# Patient Record
Sex: Male | Born: 1965 | Race: Black or African American | Hispanic: No | Marital: Married | Smoking: Former smoker
Health system: Southern US, Community
[De-identification: ages and names within clinical notes are randomized; demographics above are authoritative.]

## PROBLEM LIST (undated history)

## (undated) ENCOUNTER — Emergency Department (HOSPITAL_COMMUNITY): Admission: EM | Payer: Self-pay | Source: Home / Self Care

## (undated) DIAGNOSIS — I2510 Atherosclerotic heart disease of native coronary artery without angina pectoris: Secondary | ICD-10-CM

## (undated) DIAGNOSIS — M069 Rheumatoid arthritis, unspecified: Secondary | ICD-10-CM

## (undated) DIAGNOSIS — G4730 Sleep apnea, unspecified: Secondary | ICD-10-CM

## (undated) DIAGNOSIS — E785 Hyperlipidemia, unspecified: Secondary | ICD-10-CM

## (undated) DIAGNOSIS — R7303 Prediabetes: Secondary | ICD-10-CM

## (undated) DIAGNOSIS — F419 Anxiety disorder, unspecified: Secondary | ICD-10-CM

## (undated) DIAGNOSIS — I10 Essential (primary) hypertension: Secondary | ICD-10-CM

## (undated) HISTORY — PX: CORONARY ANGIOPLASTY WITH STENT PLACEMENT: SHX49

---

## 1998-10-27 ENCOUNTER — Emergency Department (HOSPITAL_COMMUNITY): Admission: EM | Admit: 1998-10-27 | Discharge: 1998-10-27 | Payer: Self-pay | Admitting: Emergency Medicine

## 1998-10-28 ENCOUNTER — Emergency Department (HOSPITAL_COMMUNITY): Admission: EM | Admit: 1998-10-28 | Discharge: 1998-10-28 | Payer: Self-pay | Admitting: Emergency Medicine

## 2001-01-11 ENCOUNTER — Encounter: Admission: RE | Admit: 2001-01-11 | Discharge: 2001-01-11 | Payer: Self-pay | Admitting: Occupational Medicine

## 2001-01-11 ENCOUNTER — Encounter: Payer: Self-pay | Admitting: Occupational Medicine

## 2003-01-20 ENCOUNTER — Emergency Department (HOSPITAL_COMMUNITY): Admission: EM | Admit: 2003-01-20 | Discharge: 2003-01-20 | Payer: Self-pay | Admitting: Emergency Medicine

## 2007-08-15 ENCOUNTER — Emergency Department (HOSPITAL_COMMUNITY): Admission: EM | Admit: 2007-08-15 | Discharge: 2007-08-15 | Payer: Self-pay | Admitting: Emergency Medicine

## 2008-07-30 ENCOUNTER — Inpatient Hospital Stay (HOSPITAL_COMMUNITY)
Admission: EM | Admit: 2008-07-30 | Discharge: 2008-08-02 | Payer: Self-pay | Source: Ambulatory Visit | Admitting: Emergency Medicine

## 2010-03-28 HISTORY — PX: CARDIAC CATHETERIZATION: SHX172

## 2010-06-28 ENCOUNTER — Inpatient Hospital Stay (HOSPITAL_COMMUNITY)
Admission: EM | Admit: 2010-06-28 | Discharge: 2010-07-02 | DRG: 550 | Disposition: A | Discharge status: Home / Self Care | Payer: BC Managed Care – PPO | Source: Ambulatory Visit | Attending: Interventional Cardiology | Admitting: Interventional Cardiology

## 2010-06-28 DIAGNOSIS — F17200 Nicotine dependence, unspecified, uncomplicated: Secondary | ICD-10-CM | POA: Diagnosis present

## 2010-06-28 DIAGNOSIS — I10 Essential (primary) hypertension: Secondary | ICD-10-CM | POA: Diagnosis present

## 2010-06-28 DIAGNOSIS — E119 Type 2 diabetes mellitus without complications: Secondary | ICD-10-CM | POA: Diagnosis present

## 2010-06-28 DIAGNOSIS — I4901 Ventricular fibrillation: Secondary | ICD-10-CM | POA: Diagnosis not present

## 2010-06-28 DIAGNOSIS — E785 Hyperlipidemia, unspecified: Secondary | ICD-10-CM | POA: Diagnosis present

## 2010-06-28 DIAGNOSIS — F411 Generalized anxiety disorder: Secondary | ICD-10-CM | POA: Diagnosis present

## 2010-06-28 DIAGNOSIS — IMO0002 Reserved for concepts with insufficient information to code with codable children: Secondary | ICD-10-CM | POA: Diagnosis present

## 2010-06-28 DIAGNOSIS — Z7982 Long term (current) use of aspirin: Secondary | ICD-10-CM

## 2010-06-28 DIAGNOSIS — X58XXXA Exposure to other specified factors, initial encounter: Secondary | ICD-10-CM | POA: Diagnosis present

## 2010-06-28 DIAGNOSIS — I2109 ST elevation (STEMI) myocardial infarction involving other coronary artery of anterior wall: Principal | ICD-10-CM | POA: Diagnosis present

## 2010-06-28 DIAGNOSIS — I2510 Atherosclerotic heart disease of native coronary artery without angina pectoris: Secondary | ICD-10-CM | POA: Diagnosis present

## 2010-06-28 LAB — COMPREHENSIVE METABOLIC PANEL
ALT: 25 U/L (ref 0–53)
AST: 31 U/L (ref 0–37)
Albumin: 3.1 g/dL — ABNORMAL LOW (ref 3.5–5.2)
Alkaline Phosphatase: 81 U/L (ref 39–117)
BUN: 6 mg/dL (ref 6–23)
CO2: 18 mEq/L — ABNORMAL LOW (ref 19–32)
Calcium: 7.8 mg/dL — ABNORMAL LOW (ref 8.4–10.5)
Chloride: 104 mEq/L (ref 96–112)
Creatinine, Ser: 1.01 mg/dL (ref 0.4–1.5)
GFR calc Af Amer: >60 mL/min (ref >60)
GFR calc non Af Amer: >60 mL/min (ref >60)
Glucose, Bld: 194 mg/dL — ABNORMAL HIGH (ref 70–99)
Potassium: 3 mEq/L — ABNORMAL LOW (ref 3.5–5.1)
Sodium: 134 mEq/L — ABNORMAL LOW (ref 135–145)
Total Bilirubin: 0.2 mg/dL — ABNORMAL LOW (ref 0.3–1.2)
Total Protein: 6.1 g/dL (ref 6.0–8.3)

## 2010-06-28 LAB — LIPID PANEL
Cholesterol: 186 mg/dL (ref 0–200)
HDL: 27 mg/dL — ABNORMAL LOW (ref >39)
LDL Cholesterol: 128 mg/dL — ABNORMAL HIGH (ref 0–99)
Total CHOL/HDL Ratio: 6.9 RATIO
VLDL: 31 mg/dL (ref 0–40)

## 2010-06-28 LAB — CARDIAC PANEL(CRET KIN+CKTOT+MB+TROPI)
CK, MB: 215.1 ng/mL (ref 0.3–4.0)
Relative Index: 10.7 — ABNORMAL HIGH (ref 0.0–2.5)
Total CK: 2004 U/L — ABNORMAL HIGH (ref 7–232)

## 2010-06-28 LAB — APTT: aPTT: >200 seconds (ref 24–37)

## 2010-06-28 LAB — RAPID URINE DRUG SCREEN, HOSP PERFORMED: Tetrahydrocannabinol: NOT DETECTED

## 2010-06-28 LAB — CBC
HCT: 39.9 % (ref 39.0–52.0)
Hemoglobin: 13.6 g/dL (ref 13.0–17.0)
MCH: 31.6 pg (ref 26.0–34.0)
MCHC: 34.1 g/dL (ref 30.0–36.0)
MCV: 92.6 fL (ref 78.0–100.0)
Platelets: 280 10*3/uL (ref 150–400)
RBC: 4.31 MIL/uL (ref 4.22–5.81)
RDW: 12.8 % (ref 11.5–15.5)
WBC: 9.6 10*3/uL (ref 4.0–10.5)

## 2010-06-28 LAB — PROTIME-INR
INR: 5.22 (ref 0.00–1.49)
Prothrombin Time: 47.8 seconds — ABNORMAL HIGH (ref 11.6–15.2)

## 2010-06-28 LAB — HEMOGLOBIN A1C
Hgb A1c MFr Bld: 5.8 % — ABNORMAL HIGH (ref <5.7)
Mean Plasma Glucose: 120 mg/dL — ABNORMAL HIGH (ref <117)

## 2010-06-29 LAB — CARDIAC PANEL(CRET KIN+CKTOT+MB+TROPI)
CK, MB: 98.9 ng/mL (ref 0.3–4.0)
Total CK: 1175 U/L — ABNORMAL HIGH (ref 7–232)
Troponin I: 20.77 ng/mL (ref 0.00–0.06)

## 2010-06-29 LAB — POCT I-STAT, CHEM 8
BUN: 4 mg/dL — ABNORMAL LOW (ref 6–23)
Calcium, Ion: 1.11 mmol/L — ABNORMAL LOW (ref 1.12–1.32)
TCO2: 17 mmol/L (ref 0–100)

## 2010-06-29 LAB — BASIC METABOLIC PANEL
Chloride: 100 mEq/L (ref 96–112)
Creatinine, Ser: 0.96 mg/dL (ref 0.4–1.5)
GFR calc Af Amer: >60 mL/min (ref >60)
Potassium: 4.1 mEq/L (ref 3.5–5.1)
Sodium: 132 mEq/L — ABNORMAL LOW (ref 135–145)

## 2010-06-29 LAB — CBC
HCT: 43.3 % (ref 39.0–52.0)
Hemoglobin: 15.1 g/dL (ref 13.0–17.0)
RBC: 4.74 MIL/uL (ref 4.22–5.81)
WBC: 14.1 10*3/uL — ABNORMAL HIGH (ref 4.0–10.5)

## 2010-06-29 LAB — POCT ACTIVATED CLOTTING TIME: Activated Clotting Time: 558 seconds

## 2010-06-29 LAB — PROTIME-INR: INR: 0.99 (ref 0.00–1.49)

## 2010-06-30 ENCOUNTER — Inpatient Hospital Stay (HOSPITAL_COMMUNITY): Payer: BC Managed Care – PPO

## 2010-06-30 LAB — BASIC METABOLIC PANEL
CO2: 25 mEq/L (ref 19–32)
Calcium: 8.6 mg/dL (ref 8.4–10.5)
Creatinine, Ser: 1.12 mg/dL (ref 0.4–1.5)
GFR calc Af Amer: >60 mL/min (ref >60)
Glucose, Bld: 107 mg/dL — ABNORMAL HIGH (ref 70–99)

## 2010-06-30 LAB — CARDIAC PANEL(CRET KIN+CKTOT+MB+TROPI): Total CK: 575 U/L — ABNORMAL HIGH (ref 7–232)

## 2010-07-01 LAB — BASIC METABOLIC PANEL
CO2: 23 mEq/L (ref 19–32)
Glucose, Bld: 133 mg/dL — ABNORMAL HIGH (ref 70–99)
Potassium: 3.9 mEq/L (ref 3.5–5.1)
Sodium: 133 mEq/L — ABNORMAL LOW (ref 135–145)

## 2010-07-06 LAB — POCT I-STAT, CHEM 8
BUN: 7 mg/dL (ref 6–23)
Calcium, Ion: 1.11 mmol/L — ABNORMAL LOW (ref 1.12–1.32)
Creatinine, Ser: 1.3 mg/dL (ref 0.4–1.5)
TCO2: 25 mmol/L (ref 0–100)

## 2010-07-06 LAB — CK TOTAL AND CKMB (NOT AT ARMC)
CK, MB: 106.9 ng/mL — ABNORMAL HIGH (ref 0.3–4.0)
Total CK: 831 U/L — ABNORMAL HIGH (ref 7–232)
Total CK: 977 U/L — ABNORMAL HIGH (ref 7–232)

## 2010-07-06 LAB — LIPID PANEL
HDL: 32 mg/dL — ABNORMAL LOW (ref >39)
Total CHOL/HDL Ratio: 7.9 RATIO
VLDL: 27 mg/dL (ref 0–40)

## 2010-07-06 LAB — CBC
HCT: 40.8 % (ref 39.0–52.0)
HCT: 44.9 % (ref 39.0–52.0)
Hemoglobin: 15.5 g/dL (ref 13.0–17.0)
MCHC: 34.4 g/dL (ref 30.0–36.0)
MCHC: 34.6 g/dL (ref 30.0–36.0)
MCV: 91.1 fL (ref 78.0–100.0)
Platelets: 202 10*3/uL (ref 150–400)
RDW: 14.5 % (ref 11.5–15.5)
RDW: 14.6 % (ref 11.5–15.5)

## 2010-07-06 LAB — CARDIAC PANEL(CRET KIN+CKTOT+MB+TROPI)
Total CK: 557 U/L — ABNORMAL HIGH (ref 7–232)
Troponin I: 9.45 ng/mL (ref 0.00–0.06)

## 2010-07-06 LAB — DIFFERENTIAL
Basophils Absolute: 0.2 10*3/uL — ABNORMAL HIGH (ref 0.0–0.1)
Basophils Relative: 2 % — ABNORMAL HIGH (ref 0–1)
Eosinophils Relative: 3 % (ref 0–5)
Monocytes Absolute: 1.1 10*3/uL — ABNORMAL HIGH (ref 0.1–1.0)

## 2010-07-06 LAB — BASIC METABOLIC PANEL
BUN: 5 mg/dL — ABNORMAL LOW (ref 6–23)
Chloride: 107 mEq/L (ref 96–112)
Creatinine, Ser: 0.94 mg/dL (ref 0.4–1.5)
Glucose, Bld: 99 mg/dL (ref 70–99)
Potassium: 3.8 mEq/L (ref 3.5–5.1)

## 2010-07-06 LAB — POCT CARDIAC MARKERS: Myoglobin, poc: 83.1 ng/mL (ref 12–200)

## 2010-07-06 LAB — BRAIN NATRIURETIC PEPTIDE: Pro B Natriuretic peptide (BNP): 110 pg/mL — ABNORMAL HIGH (ref 0.0–100.0)

## 2010-07-14 NOTE — Discharge Summary (Signed)
  Jacob [name], Jacob [name]                ACCOUNT NO.:  [id number]  MEDICAL RECORD NO.:  [id number]           PATIENT TYPE:  I  LOCATION:  2028                         FACILITY:  [facility]  PHYSICIAN:  Corky Crafts, MDDATE OF BIRTH:  16-Feb-1966  DATE OF ADMISSION:  06/28/2010 DATE OF DISCHARGE:  07/02/2010                              DISCHARGE SUMMARY   PRIMARY CARDIOLOGIST:  Lyn Records, MD  FINAL DIAGNOSES: 1. Acute anterior wall ST-elevation myocardial infarction. 2. Coronary artery disease. 3. Tobacco abuse. 4. Hyperlipidemia.  PROCEDURE PERFORMED:  Cardiac catheterization on June 28, 2010, with drug-eluting stent placement to the mid LAD.  Overall ejection fraction was 40%.  HOSPITAL COURSE:  The patient came in on June 28, 2010, and had anterior ST-elevation.  He was brought emergently for cardiac catheterization and prior to the cath had a VF arrest.  He was successfully shocked back into normal sinus rhythm.  He underwent catheterization and had a straight forward angioplasty to the LAD with drug-eluting stent.  He tolerated the procedure well.  He was monitored for several days.  He had some issues with low blood pressure.  He did have a smoking cessation consult.  His blood pressure medicines were titrated and eventually his blood pressure stabilized in the low 100 systolic range. He had no further chest pain.  He continued have some right knee swelling related to prior ACL tear and meniscus tear.  Peak CK was 2004 with a peak troponin of 29.2.  ACTIVITY:  Increase activity slowly.  No lifting more than 10 pounds for about a week.  DIET:  Low-sodium, heart-healthy diet.  He needs to stop smoking.  FOLLOWUP APPOINTMENT:  With Dr. Katrinka Blazing in 7-10 days.  He should call the office for an appointment.  DISCHARGE MEDICATIONS: 1. Aspirin 325 mg daily. 2. Lisinopril 5 mg daily. 3. Metoprolol XL 25 mg daily. 4. Sublingual nitroglycerin p.r.n. 5. Prasugrel 10 mg  daily. 6. Rosuvastatin 10 mg daily.  OTHER INSTRUCTIONS:  We will have him see Dr. Katrinka Blazing before he returns to work.  We will tentatively set July 19, 2010, as the possible day that he could return to work.     Corky Crafts, MD     JSV/MEDQ  D:  07/02/2010  T:  07/03/2010  Job:  [id number]  Electronically Signed by Lance Muss MD on 07/14/2010 02:27:40 PM

## 2010-07-14 NOTE — Cardiovascular Report (Signed)
[name], [name]                ACCOUNT NO.:  [id number]  MEDICAL RECORD NO.:  [id number]           PATIENT TYPE:  I  LOCATION:  2910                         FACILITY:  [facility]  PHYSICIAN:  Jacob [name], MDDATE OF BIRTH:  12-23-1965  DATE OF PROCEDURE: DATE OF DISCHARGE:                           CARDIAC CATHETERIZATION   PROCEDURE PERFORMED:  Left heart catheterization, left ventriculogram, coronary angiogram, PCI to LAD.  OPERATOR:  Jacob Crafts, MD  INDICATIONS:  Acute anterior ST elevation MI with defib arrest.  PROCEDURE NOTE:  The patient was brought to the cath lab.  He was prepped and draped in the usual sterile fashion, consent was implied. He had a brief VF arrest when he got on the table, this was resolved with successful April 02, 2012rillation.  His right groin was infiltrated with 1% lidocaine.  A 6-French sheath was placed into the right common femoral artery using modified Seldinger technique.  Right coronary artery angiography was performed using a JR-4 pigtail catheter. The catheter was advanced to the vessel ostium under fluoroscopic guidance.  Digital angiography was performed in multiple projections using hand injection of contrast.  Left coronary artery angiography was performed using a CLS 3.5 guiding catheter.  Catheter was advanced to the vessel ostium under fluoroscopic guidance.  Digital angiography was performed in multiple projections using hand injection of contrast.  The intervention was performed through the CLS guide, please see below for details.  A pigtail catheter was advanced to the ascending aorta and across the aortic valve under fluoroscopic guidance.  Power injection of contrast performed in RAO projection to image the left ventricle. Catheter was pulled back under continuous hemodynamic pressure monitoring.  The sheath was removed and Angio-Seal was deployed for hemostasis.  Angiomax was used.  Effient was also given to  the patient.  FINDINGS:  The right coronary artery is a large dominant vessel.  There is moderate diffuse atherosclerosis.  The stent in the distal RCA is patent. Left main has an ostial 20% plaque. Left circumflex is a large vessel.  There are mild irregularities.  The OM-1 and OM-2 are medium-sized and patent. Left anterior descending is occluded just after the first diagonal. After reperfusion it was noted that there was a long mid LAD lesion, first and second diagonals are medium-sized and widely patent.  There was a 40-50% distal LAD stenosis. Left ventriculogram showed mid to distal anterior apical hypokinesis, severe degree.  The estimated ejection fraction was 40%.  HEMODYNAMIC RESULTS:  Left ventricular pressure 85/17 with an LVEDP of 27 mmHg.  Aortic pressure 81/63 with a mean aortic pressure of 71.  PCI NARRATIVE:  A CLS 3.5 guiding catheter was used.  Prowater wire was used to cross the lesion in the LAD.  An Export catheter successfully retrieved significant thrombus, 2.5 x 15 Apex balloon was used to pre- dilate the area in the mid LAD.  Several doses of nitroglycerin were given intracoronary to treat distal vasospasm.  A 3.0 x 28 Promus stent was then deployed in the mid LAD at 16 atmospheres.  The stent was postdilated with a 3.5 x 20 Trent  Quantum balloon inflated to 16 atmospheres in the mid stent at 18 atmospheres in the proximal stent. There was no residual stenosis.  Lesion length was 24 mm.  TIMI 3 flow was restored after initial TIMI flow was 0.  IMPRESSION: 1. Acute anterior myocardial infarction secondary to occluded left     anterior descending coronary artery, ventricular fibrillation     arrest in the cath lab related to ischemia. 2. Decreased left ventricular function with moderately increased left     ventricular end diastolic pressure. 3. Patent right coronary artery stent. 4. No aortic valve gradient.  RECOMMENDATIONS:  Continue aspirin and Effient.   He will need smoking cessation, he will be watched in the CCU.     Jacob Crafts, MD     JSV/MEDQ  D:  06/28/2010  T:  06/29/2010  Job:  [id number]  cc:   Jacob [name], M.D.  Electronically Signed by Jacob Muss MD on 07/14/2010 02:27:54 PM

## 2010-07-14 NOTE — H&P (Signed)
  NAMEELI, Jacob                ACCOUNT NO.:  [id number]  MEDICAL RECORD NO.:  [id number]           PATIENT TYPE:  I  LOCATION:  2910                         FACILITY:  [facility]  PHYSICIAN:  Corky Crafts, MDDATE OF BIRTH:  1965-11-17  DATE OF ADMISSION:  06/28/2010 DATE OF DISCHARGE:                             HISTORY & PHYSICAL   REASON FOR ADMISSION:  Acute anterior MI.  HISTORY OF PRESENT ILLNESS:  The patient is a 45 year old male with a history of inferior ST-elevation MI and 2010.  At that time, he had a drug-eluting stent placed to the distal right coronary artery.  He had chest pain starting about 8:30 this morning.  He called EMS and had anterior ST elevation on his ECG.  A code STEMI was called.  Upon getting on the cath stable, he also had some VF, which was subsequently shocked and normal sinus rhythm was restored.  He felt dizziness with the arrhythmia.  Currently, his pain is about 6/10 and has been waxing and waning somewhat.  PAST MEDICAL HISTORY: 1. Coronary artery disease with prior MI. 2. Borderline diabetes. 3. History of polysubstance abuse. 4. Right knee problem.  PAST SURGICAL HISTORY:  Teeth removal and testicular torsion surgery.  ALLERGIES:  No known drug allergies.  MEDICATIONS AT HOME:  Aspirin 81 mg daily.  SOCIAL HISTORY:  He is a smoker.  He drinks beer daily.  He has a history of polysubstance abuse.  FAMILY HISTORY:  Significant coronary artery disease in his father.  REVIEW OF SYSTEMS:  Significant for the chest discomfort as noted above. No bleeding problems.  He has right knee pain.  No swelling.  No focal weakness.  All other systems are negative.  PHYSICAL EXAMINATION:  VITAL SIGNS:  Blood pressure 110/60 and heart rate of 18. GENERAL:  He is awake and alert, in no apparent stress. HEAD:  Normocephalic and atraumatic. EYES:  Extraocular movements are intact. NECK:  No JVD. CARDIOVASCULAR:  Regular rate and  rhythm. LUNGS:  No wheezing. ABDOMEN:  Soft and nontender. EXTREMITIES:  2+ right groin pulse. EXTREMITIES:  No edema. NEURO:  No focal deficits.  LABORATORY WORK:  Pending.  ECG shows normal sinus rhythm with anterior ST elevation.  ASSESSMENT/PLAN: 1. This is a 45 year old with acute anterior ST-elevation MI.  He will     be brought to the Cath Lab.  He will likely undergo intervention     and plans will be based on this. 2. He needs to stop smoking. 3. He needs to be on a statin as well.     Corky Crafts, MD     JSV/MEDQ  D:  06/28/2010  T:  06/29/2010  Job:  [id number]  Electronically Signed by Lance Muss MD on 07/14/2010 02:27:46 PM

## 2010-07-26 ENCOUNTER — Ambulatory Visit (HOSPITAL_COMMUNITY): Payer: BC Managed Care – PPO

## 2010-07-28 ENCOUNTER — Ambulatory Visit (HOSPITAL_COMMUNITY): Payer: BC Managed Care – PPO

## 2010-07-29 ENCOUNTER — Emergency Department (HOSPITAL_COMMUNITY)
Admission: EM | Admit: 2010-07-29 | Discharge: 2010-07-29 | Disposition: A | Discharge status: Home / Self Care | Payer: BC Managed Care – PPO | Attending: Emergency Medicine | Admitting: Emergency Medicine

## 2010-07-29 ENCOUNTER — Emergency Department (HOSPITAL_COMMUNITY): Payer: BC Managed Care – PPO

## 2010-07-29 DIAGNOSIS — R079 Chest pain, unspecified: Secondary | ICD-10-CM | POA: Insufficient documentation

## 2010-07-29 DIAGNOSIS — R05 Cough: Secondary | ICD-10-CM | POA: Insufficient documentation

## 2010-07-29 DIAGNOSIS — R059 Cough, unspecified: Secondary | ICD-10-CM | POA: Insufficient documentation

## 2010-07-29 DIAGNOSIS — Z7982 Long term (current) use of aspirin: Secondary | ICD-10-CM | POA: Insufficient documentation

## 2010-07-29 DIAGNOSIS — Z79899 Other long term (current) drug therapy: Secondary | ICD-10-CM | POA: Insufficient documentation

## 2010-07-29 DIAGNOSIS — I252 Old myocardial infarction: Secondary | ICD-10-CM | POA: Insufficient documentation

## 2010-07-29 LAB — CBC
MCH: 31.1 pg (ref 26.0–34.0)
MCHC: 34.3 g/dL (ref 30.0–36.0)
MCV: 90.7 fL (ref 78.0–100.0)
Platelets: 259 10*3/uL (ref 150–400)
RBC: 4.72 MIL/uL (ref 4.22–5.81)

## 2010-07-29 LAB — BASIC METABOLIC PANEL
BUN: 10 mg/dL (ref 6–23)
CO2: 27 mEq/L (ref 19–32)
Chloride: 104 mEq/L (ref 96–112)
Potassium: 3.8 mEq/L (ref 3.5–5.1)

## 2010-07-29 LAB — DIFFERENTIAL
Eosinophils Absolute: 0.2 10*3/uL (ref 0.0–0.7)
Eosinophils Relative: 3 % (ref 0–5)
Lymphs Abs: 2.3 10*3/uL (ref 0.7–4.0)
Monocytes Absolute: 0.7 10*3/uL (ref 0.1–1.0)
Monocytes Relative: 10 % (ref 3–12)
Neutrophils Relative %: 53 % (ref 43–77)

## 2010-07-29 LAB — POCT CARDIAC MARKERS
Myoglobin, poc: 86.3 ng/mL (ref 12–200)
Troponin i, poc: <0.05 ng/mL (ref 0.00–0.09)
Troponin i, poc: <0.05 ng/mL (ref 0.00–0.09)

## 2010-07-30 ENCOUNTER — Ambulatory Visit (HOSPITAL_COMMUNITY): Payer: BC Managed Care – PPO

## 2010-08-02 ENCOUNTER — Ambulatory Visit (HOSPITAL_COMMUNITY): Payer: BC Managed Care – PPO

## 2010-08-04 ENCOUNTER — Ambulatory Visit (HOSPITAL_COMMUNITY): Payer: BC Managed Care – PPO

## 2010-08-05 NOTE — Consult Note (Signed)
Jacob [name], Jacob [name]                ACCOUNT NO.:  [id number]  MEDICAL RECORD NO.:  [id number]           PATIENT TYPE:  E  LOCATION:  [facility]                         FACILITY:  [facility]  PHYSICIAN:  Lyn Records, M.D.   DATE OF BIRTH:  Jan 14, 1966  DATE OF CONSULTATION:  07/29/2010 DATE OF DISCHARGE:  07/29/2010                                CONSULTATION   REASON FOR EVALUATION:  Chest pain.  SUBJECTIVE:  The patient is 12 and has a prior history of coronary disease, having presented with an acute inferior infarction in 2010 at which time he received a drug-eluting stent in the distal RCA.  1 month ago he presented with LAD, total occlusion, and received a DES stent during ST elevation MI.  He has been asymptomatic since discharge from the hospital on July 01, 2010.  Last evening, he had coughing.  He awakened this morning, still with some coughing and had discomfort in the chest that he now characterizes as a soreness.  It was there if he would take deep breaths or due certain movements.  Coughing would cause discomfort to linger.  He has an anxiety disorder.  As the discomfort lingered during the day, he became uncertain of the source and came to the emergency room.  It does not feel like either episode of previous cardiac ischemia.  There is no exertional component.  He denies chills and fever.  There is no pleuritic component.  MEDICAL REGIMEN: 1. Effient 10 mg per day. 2. Crestor 10 mg per day. 3. Metoprolol succinate 25 mg daily. 4. Lisinopril 5 mg daily. 5. Aspirin 325 mg daily.  HABITS:  He has discontinued smoking.  SIGNIFICANT MEDICAL PROBLEMS: 1. Anxiety disorder. 2. Coronary atherosclerosis. 3. Hyperlipidemia.  PHYSICAL EXAMINATION:  GENERAL:  The patient is on the hospital gurney. He is in no distress. VITAL SIGNS:  Blood pressure is 110/70, heart rate 70. NECK:  The neck veins are not distended.  No bruits heard. LUNGS:  Clear. CARDIAC:  No S4 gallop.  No  murmur or rub is heard. ABDOMEN:  Soft. EXTREMITIES:  No edema.  Pulses 2+ and symmetric in the upper and lower extremities.  Point-of-care marker done approximately 4 hours after the patient's pain started is normal.  Chest x-ray is unremarkable.  Other laboratory data is unremarkable with hemoglobin of 14.7, potassium 3.8, creatinine 1.04. Chest x-ray is unremarkable.  EKG demonstrates poor R-wave progression with mid anterior lead T-wave inversion improved compared to the EKG tracing of June 29, 2010.  CONCLUSIONS: 1. Probable noncardiac chest pain. 2. Coronary atherosclerosis.     a.     Recent anteroseptal infarction with drug-eluting stent      implantation.     b.     Right coronary infarction treated with DES stent 2010. 3. Anxiety disorder. 4. Hyperlipidemia on therapy. 5. Continue cigarette smoking cessation.  PLAN:  The patient will be discharged from the emergency room.  CLINICAL OBSERVATION:  He is to return if the change in nature of the discomfort or familiar symptoms of ischemia occur.     Lyn Records, M.D.  HWS/MEDQ  D:  07/29/2010  T:  07/30/2010  Job:  [id number]  Electronically Signed by Verdis Prime M.D. on 08/05/2010 12:21:31 PM

## 2010-08-06 ENCOUNTER — Ambulatory Visit (HOSPITAL_COMMUNITY): Payer: BC Managed Care – PPO

## 2010-08-09 ENCOUNTER — Ambulatory Visit (HOSPITAL_COMMUNITY): Payer: BC Managed Care – PPO

## 2010-08-10 NOTE — Cardiovascular Report (Signed)
NAMEMITCHEL, DELDUCA                ACCOUNT NO.:  [id number]   MEDICAL RECORD NO.:  [id number]          PATIENT TYPE:  INP   LOCATION:  2906                         FACILITY:  [facility]   PHYSICIAN:  Lyn Records, M.D.   DATE OF BIRTH:  31-Mar-1965   DATE OF PROCEDURE:  07/30/2008  DATE OF DISCHARGE:                            CARDIAC CATHETERIZATION   INDICATION FOR THIS STUDY:  Acute inferior myocardial infarction.   PROCEDURE PERFORMED:  1. Left heart cath.  2. Selective coronary angio.  3. Left ventriculography.  4. Drug-eluting stent, distal right coronary artery.   DESCRIPTION:  The patient is a 45 year old African American gentleman  who has been having waxing and waning chest pain for the past 48 hours.  Starting at 2 p.m. this afternoon, a persisting substernal discomfort  developed.  He was brought immediately to the emergency room where he  was evaluated and found to have mild inferior ST elevation.  There was  some confusion about the STEMI call coverage.  Dr. Excell Seltzer was initially  paged, who was in another STEMI case.  Dr. Riley Kill was also tied up.  After reviewing this STEMI call schedule, however, it was determined  that Dr. Excell Seltzer was not the STEMI physician of that [facility], was on-call for the ER.  Because Dr. Anne Fu is on call this  evening, our arrangement is that we will cover the ER.  We therefore  brought the patient emergently to the cath lab where a 6-French sheath  was placed using the modified Seldinger technique.  After initiation of  the sheath, Angiomax was administered.  We then proceeded to perform  coronary angiography with a 6-French A2 multipurpose catheter.  We then  also used a 6-French JR-4 guide catheter to perform angioplasty on the  right coronary with distal stenting using a DES 23-mm long Xience V  stent.  We postdilated with a 3.25 x 20-mm long Voyager Bennett.  There was  some distal emboli that resolved with intracoronary  verapamil.  IV  dopamine and atropine were used to improve hemodynamics during the case.  He developed severe hypotension and bradycardia after initial flow was  reestablished.  We then continued IV fluids, atropine, and dopamine.   The patient's pain resolved completely.  ST-segment changes resolved  completely.  Angio-Seal was used to close the groin site.  No  complications occurred.   RESULTS:  1. Hemodynamic data:      a.     Aortic pressure 92/72.      b.     Left ventricular pressure 105/21.  2. Left ventriculography:  Normal LV size and function.  EF 60%.  3. Coronary angio.      a.     Left main coronary:  25% proximal.      b.     Left anterior descending coronary:  50% mid.      c.     Circumflex artery:  Luminal irregularities.      d.     Right coronary artery:  100% distally.  4. PCI of the right coronary distal:  100% to 0% after stenting with a      Xience V 3.0, postdilated to 3.25 mm with a Voyager  with TIMI      grade 3 flow.   CONCLUSION:  1. Successful RCA recanalization and stenting during RCA STEMI.  2. Moderate LAD disease.  3. Preserved LV function.   PLAN:  Angiomax and Integrilin.  Aspirin and Plavix for greater than 1  year.  Risk factor modification.  Further management pending clinical  profile.      Lyn Records, M.D.  Electronically Signed     HWS/MEDQ  D:  07/30/2008  T:  07/31/2008  Job:  [id number]

## 2010-08-10 NOTE — H&P (Signed)
Jacob [name], Jacob [name]                ACCOUNT NO.:  [id number]   MEDICAL RECORD NO.:  [id number]          PATIENT TYPE:  INP   LOCATION:  2906                         FACILITY:  [facility]   PHYSICIAN:  Lyn Records, M.D.   DATE OF BIRTH:  12/25/1965   DATE OF ADMISSION:  07/30/2008  DATE OF DISCHARGE:                              HISTORY & PHYSICAL   CHIEF COMPLAINT:  Chest pain.   HISTORY OF PRESENT ILLNESS:  Jacob [name] is a 45 year old male patient who  began having substernal chest pain at 2:00 p.m. today.  The pain was  unrelenting.  He presented to the emergency room where ST-segment  elevations were noted in the inferior lead.  He received IV  nitroglycerin, aspirin 300 mg, Plavix, and morphine, and he was brought  directly to the Cardiac Catheterization Lab.   FAMILY HISTORY:  He states that his father had heart disease.   ALLERGIES:  None.   MEDICATIONS:  None.   SOCIAL HISTORY:  He does smoke.  He does drink 40 ounces of beer a day.  Denies illicit drugs.   PAST MEDICAL HISTORY:  1. Anxiety.  2. Polysubstance abuse.   PHYSICAL EXAMINATION:  VITAL SIGNS:  Unremarkable.  Pulse 107, blood  pressure 167/108, respirations 20, and O2 saturation 100% on 2 L.  HEENT:  Grossly normal.  Sclerae clear.  Conjunctivae normal.  Nares  without drainage.  NECK:  No carotid upstrokes or carotid bruits.  No JVD or thyromegaly.  CHEST:  Clear to auscultation bilaterally.  No wheezes or rhonchi.  HEART:  Regular rate and rhythm.  No evidence of murmur, rub, or ectopy.  ABDOMEN:  Good bowel sounds.  Nontender, nondistended.  No masses.  No  bruits.  EXTREMITIES:  No peripheral edema.  SKIN:  Warm and dry.  NEUROLOGIC:  Cranial nerves II through XII grossly intact.   LABORATORY DATA:  Hemoglobin 16.7, hematocrit 49.  PT 13.1, INR 1.0.  Sodium 141, potassium 3.2, BUN 7, creatinine 1.3, and glucose 160.   ASSESSMENT AND PLAN:  1. Acute inferior myocardial infarction, to Cath Lab  emergently.  2. Hypokalemia, replete.  3. Polysubstance abuse, smoking cessation counseling.      Guy Franco, P.A.      Lyn Records, M.D.  Electronically Signed    LB/MEDQ  D:  07/30/2008  T:  07/31/2008  Job:  [id number]

## 2010-08-11 ENCOUNTER — Ambulatory Visit (HOSPITAL_COMMUNITY): Payer: BC Managed Care – PPO

## 2010-08-13 ENCOUNTER — Ambulatory Visit (HOSPITAL_COMMUNITY): Payer: BC Managed Care – PPO

## 2010-08-13 NOTE — Discharge Summary (Signed)
NAMEDAM, ASHRAF                ACCOUNT NO.:  [id number]   MEDICAL RECORD NO.:  [id number]           PATIENT TYPE:   LOCATION:                                 FACILITY:   PHYSICIAN:  Lyn Records, M.D.   DATE OF BIRTH:  04-19-1965   DATE OF ADMISSION:  DATE OF DISCHARGE:                               DISCHARGE SUMMARY   DISCHARGE DIAGNOSES:  1. Status post acute inferior myocardial infarction, ST-segment      elevation myocardial infarction.  2. Coronary artery disease.  3. Dyslipidemia.  4. Tobacco abuse, smoking cessation counseling.   Jacob [name] is a 45 year old male patient who began having chest pain  at 2:00 p.m. on the date of admission.  This was unrelenting, and he  called EMS.  ST segments on EKG were showing increased/elevation in the  inferior leads.  He received nitroglycerin, aspirin, 300 mg of Plavix,  and morphine.  He was brought directly to the cardiac catheterization  lab.  He was found to have a totally occluded distal right coronary  artery.  A XIENCE V stent was implanted with good results.   The patient was kept in the hospital over the next several days.  We  counseled the patient on smoking cessation.  We also had the cardiac  rehab see the patient.  Other lab work shows that his troponin was  elevated at 9.45.  Total cholesterol 254, triglycerides 135, HDL 32, LDL  195.  Sodium 139, potassium 3.8, BUN 5, creatinine 0.94, BNP 110.  Hemoglobin 15.5, hematocrit 44.9, white count 9.2, platelets 231.   The patient is being discharged to home in stable but improved  condition.  He is to follow up with Dr. Katrinka Blazing on Aug 07, 2008, at 2:40  p.m.   He is to go home on:  1. Plavix 75 mg a day for at least 1 year.  2. Enteric-coated aspirin 325 mg a day for at least 1 year.  3. Zocor 40 mg nightly.  4. Sublingual nitroglycerin p.r.n. chest pain.  5. NicoDerm patch 21 mg a day for 7 days, then 14 mg a day for 14      days.      Guy Franco,  P.A.      Lyn Records, M.D.  Electronically Signed    LB/MEDQ  D:  09/18/2008  T:  09/19/2008  Job:  [id number]

## 2010-08-16 ENCOUNTER — Ambulatory Visit (HOSPITAL_COMMUNITY): Payer: BC Managed Care – PPO

## 2010-08-18 ENCOUNTER — Ambulatory Visit (HOSPITAL_COMMUNITY): Payer: BC Managed Care – PPO

## 2010-08-20 ENCOUNTER — Ambulatory Visit (HOSPITAL_COMMUNITY): Payer: BC Managed Care – PPO

## 2010-08-23 ENCOUNTER — Ambulatory Visit (HOSPITAL_COMMUNITY): Payer: BC Managed Care – PPO

## 2010-08-25 ENCOUNTER — Ambulatory Visit (HOSPITAL_COMMUNITY): Payer: BC Managed Care – PPO

## 2010-08-27 ENCOUNTER — Ambulatory Visit (HOSPITAL_COMMUNITY): Payer: BC Managed Care – PPO

## 2010-08-30 ENCOUNTER — Ambulatory Visit (HOSPITAL_COMMUNITY): Payer: BC Managed Care – PPO

## 2010-09-01 ENCOUNTER — Ambulatory Visit (HOSPITAL_COMMUNITY): Payer: BC Managed Care – PPO

## 2010-09-03 ENCOUNTER — Ambulatory Visit (HOSPITAL_COMMUNITY): Payer: BC Managed Care – PPO

## 2010-09-06 ENCOUNTER — Ambulatory Visit (HOSPITAL_COMMUNITY): Payer: BC Managed Care – PPO

## 2010-09-08 ENCOUNTER — Ambulatory Visit (HOSPITAL_COMMUNITY): Payer: BC Managed Care – PPO

## 2010-09-10 ENCOUNTER — Ambulatory Visit (HOSPITAL_COMMUNITY): Payer: BC Managed Care – PPO

## 2010-09-13 ENCOUNTER — Ambulatory Visit (HOSPITAL_COMMUNITY): Payer: BC Managed Care – PPO

## 2010-09-15 ENCOUNTER — Ambulatory Visit (HOSPITAL_COMMUNITY): Payer: BC Managed Care – PPO

## 2010-09-17 ENCOUNTER — Ambulatory Visit (HOSPITAL_COMMUNITY): Payer: BC Managed Care – PPO

## 2010-09-20 ENCOUNTER — Ambulatory Visit (HOSPITAL_COMMUNITY): Payer: BC Managed Care – PPO

## 2010-09-22 ENCOUNTER — Ambulatory Visit (HOSPITAL_COMMUNITY): Payer: BC Managed Care – PPO

## 2010-09-24 ENCOUNTER — Ambulatory Visit (HOSPITAL_COMMUNITY): Payer: BC Managed Care – PPO

## 2010-09-27 ENCOUNTER — Ambulatory Visit (HOSPITAL_COMMUNITY): Payer: BC Managed Care – PPO

## 2010-09-29 ENCOUNTER — Ambulatory Visit (HOSPITAL_COMMUNITY): Payer: BC Managed Care – PPO

## 2010-10-01 ENCOUNTER — Ambulatory Visit (HOSPITAL_COMMUNITY): Payer: BC Managed Care – PPO

## 2010-10-04 ENCOUNTER — Ambulatory Visit (HOSPITAL_COMMUNITY): Payer: BC Managed Care – PPO

## 2010-10-06 ENCOUNTER — Ambulatory Visit (HOSPITAL_COMMUNITY): Payer: BC Managed Care – PPO

## 2010-10-08 ENCOUNTER — Ambulatory Visit (HOSPITAL_COMMUNITY): Payer: BC Managed Care – PPO

## 2010-10-11 ENCOUNTER — Ambulatory Visit (HOSPITAL_COMMUNITY): Payer: BC Managed Care – PPO

## 2010-10-13 ENCOUNTER — Ambulatory Visit (HOSPITAL_COMMUNITY): Payer: BC Managed Care – PPO

## 2010-10-15 ENCOUNTER — Ambulatory Visit (HOSPITAL_COMMUNITY): Payer: BC Managed Care – PPO

## 2010-10-18 ENCOUNTER — Ambulatory Visit (HOSPITAL_COMMUNITY): Payer: BC Managed Care – PPO

## 2010-10-20 ENCOUNTER — Ambulatory Visit (HOSPITAL_COMMUNITY): Payer: BC Managed Care – PPO

## 2010-10-22 ENCOUNTER — Ambulatory Visit (HOSPITAL_COMMUNITY): Payer: BC Managed Care – PPO

## 2010-10-25 ENCOUNTER — Ambulatory Visit (HOSPITAL_COMMUNITY): Payer: BC Managed Care – PPO

## 2010-10-27 ENCOUNTER — Ambulatory Visit (HOSPITAL_COMMUNITY): Payer: BC Managed Care – PPO

## 2010-10-29 ENCOUNTER — Ambulatory Visit (HOSPITAL_COMMUNITY): Payer: BC Managed Care – PPO

## 2010-11-01 ENCOUNTER — Ambulatory Visit (HOSPITAL_COMMUNITY): Payer: BC Managed Care – PPO

## 2010-11-03 ENCOUNTER — Ambulatory Visit (HOSPITAL_COMMUNITY): Payer: BC Managed Care – PPO

## 2010-11-05 ENCOUNTER — Ambulatory Visit (HOSPITAL_COMMUNITY): Payer: BC Managed Care – PPO

## 2010-11-08 ENCOUNTER — Ambulatory Visit (HOSPITAL_COMMUNITY): Payer: BC Managed Care – PPO

## 2010-11-10 ENCOUNTER — Ambulatory Visit (HOSPITAL_COMMUNITY): Payer: BC Managed Care – PPO

## 2010-11-12 ENCOUNTER — Ambulatory Visit (HOSPITAL_COMMUNITY): Payer: BC Managed Care – PPO

## 2010-11-15 ENCOUNTER — Ambulatory Visit (HOSPITAL_COMMUNITY): Payer: BC Managed Care – PPO

## 2010-11-17 ENCOUNTER — Ambulatory Visit (HOSPITAL_COMMUNITY): Payer: BC Managed Care – PPO

## 2010-11-19 ENCOUNTER — Ambulatory Visit (HOSPITAL_COMMUNITY): Payer: BC Managed Care – PPO

## 2010-11-22 ENCOUNTER — Ambulatory Visit (HOSPITAL_COMMUNITY): Payer: BC Managed Care – PPO

## 2010-11-24 ENCOUNTER — Ambulatory Visit (HOSPITAL_COMMUNITY): Payer: BC Managed Care – PPO

## 2010-11-26 ENCOUNTER — Ambulatory Visit (HOSPITAL_COMMUNITY): Payer: BC Managed Care – PPO

## 2010-11-29 ENCOUNTER — Ambulatory Visit (HOSPITAL_COMMUNITY): Payer: BC Managed Care – PPO

## 2010-12-01 ENCOUNTER — Ambulatory Visit (HOSPITAL_COMMUNITY): Payer: BC Managed Care – PPO

## 2010-12-03 ENCOUNTER — Ambulatory Visit (HOSPITAL_COMMUNITY): Payer: BC Managed Care – PPO

## 2010-12-06 ENCOUNTER — Ambulatory Visit (HOSPITAL_COMMUNITY): Payer: BC Managed Care – PPO

## 2010-12-08 ENCOUNTER — Ambulatory Visit (HOSPITAL_COMMUNITY): Payer: BC Managed Care – PPO

## 2010-12-10 ENCOUNTER — Ambulatory Visit (HOSPITAL_COMMUNITY): Payer: BC Managed Care – PPO

## 2011-04-20 ENCOUNTER — Emergency Department (HOSPITAL_COMMUNITY)
Admission: EM | Admit: 2011-04-20 | Discharge: 2011-04-20 | Disposition: A | Discharge status: Home / Self Care | Payer: BC Managed Care – PPO | Attending: Emergency Medicine | Admitting: Emergency Medicine

## 2011-04-20 ENCOUNTER — Encounter (HOSPITAL_COMMUNITY): Payer: Self-pay | Admitting: Emergency Medicine

## 2011-04-20 DIAGNOSIS — I10 Essential (primary) hypertension: Secondary | ICD-10-CM | POA: Insufficient documentation

## 2011-04-20 DIAGNOSIS — R040 Epistaxis: Secondary | ICD-10-CM | POA: Insufficient documentation

## 2011-04-20 DIAGNOSIS — I2510 Atherosclerotic heart disease of native coronary artery without angina pectoris: Secondary | ICD-10-CM | POA: Insufficient documentation

## 2011-04-20 HISTORY — DX: Atherosclerotic heart disease of native coronary artery without angina pectoris: I25.10

## 2011-04-20 HISTORY — DX: Essential (primary) hypertension: I10

## 2011-04-20 MED ORDER — SILVER NITRATE-POT NITRATE 75-25 % EX MISC
1.0000 | Freq: Once | CUTANEOUS | Status: AC
  Administered 2011-04-20: 1 via TOPICAL

## 2011-04-20 MED ORDER — METOPROLOL SUCCINATE ER 25 MG PO TB24
25.0000 mg | ORAL_TABLET | Freq: Every day | ORAL | Status: AC
  Administered 2011-04-20: 25 mg via ORAL
  Filled 2011-04-20: qty 1

## 2011-04-20 NOTE — ED Provider Notes (Signed)
History     CSN: [id number]  Arrival date & time 04/20/11  1430   First MD Initiated Contact with Patient 04/20/11 1541      Chief Complaint  Patient presents with  . Epistaxis    (Consider location/radiation/quality/duration/timing/severity/associated sxs/prior treatment) HPI Patient presents emergent complaints of a nosebleed that started this afternoon. Patient didn't put pressure on it but it would not stop initially so he came to the emergent. Patient did put some tissue in his right nares and that has subsequently stopped the bleeding. Patient denies any headache. He does have history of having nosebleeds as a child but hasn't had one in a while. Patient also does mention he hasn't taken his blood pressure medications for the last 3 days because he has not had the money to fill them. He does plan on getting them filled tomorrow. Patient denies any bleeding elsewhere. Past Medical History  Diagnosis Date  . Coronary artery disease   . Hypertension     Past Surgical History  Procedure Date  . Coronary stent placement     No family history on file.  History  Substance Use Topics  . Smoking status: Never Smoker   . Smokeless tobacco: Not on file  . Alcohol Use: Yes      Review of Systems  All other systems reviewed and are negative.    Allergies  Review of patient's allergies indicates no known allergies.  Home Medications   Current Outpatient Rx  Name Route Sig Dispense Refill  . ASPIRIN EC 325 MG PO TBEC Oral Take 325 mg by mouth daily.    Marland Kitchen METOPROLOL SUCCINATE ER 25 MG PO TB24 Oral Take 25 mg by mouth daily.    Marland Kitchen PRASUGREL HCL 10 MG PO TABS Oral Take 10 mg by mouth daily.      BP 149/103  Pulse 98  Temp 98 F (36.7 C)  SpO2 98%  Physical Exam  Nursing note and vitals reviewed. Constitutional: He appears well-developed and well-nourished. No distress.  HENT:  Head: Normocephalic and atraumatic.  Right Ear: External ear normal.  Left Ear:  External ear normal.  Nose: No sinus tenderness, nasal deformity, septal deviation or nasal septal hematoma.       Dried blood right nares, no active bleeding  Eyes: Conjunctivae are normal. Right eye exhibits no discharge. Left eye exhibits no discharge. No scleral icterus.  Neck: Neck supple. No tracheal deviation present.  Cardiovascular: Normal rate, regular rhythm and intact distal pulses.   Pulmonary/Chest: Effort normal and breath sounds normal. No stridor. No respiratory distress. He has no wheezes. He has no rales.  Abdominal: Soft. Bowel sounds are normal. He exhibits no distension. There is no tenderness. There is no rebound and no guarding.  Musculoskeletal: He exhibits no edema and no tenderness.  Neurological: He is alert. He has normal strength. No sensory deficit. Cranial nerve deficit:  no gross defecits noted. He exhibits normal muscle tone. He displays no seizure activity. Coordination normal.  Skin: Skin is warm and dry. No rash noted.  Psychiatric: He has a normal mood and affect.    ED Course  Procedures (including critical care time) Silver nitrate applicator was applied to the right anterior nares. Patient tolerated procedure well Labs Reviewed - No data to display No results found.   1. Epistaxis       MDM  Patient with recurrent epistaxis. No active bleeding at this time. I encouraged him to use saline spray into apply pressure if he  has any recurrent bleeding. Patient was given a dose of his blood pressure medications. He does plan on getting them filled tomorrow        Celene Kras, MD 04/20/11 [phone]

## 2011-04-20 NOTE — ED Notes (Signed)
Nose bleed started this afaternoon stops and starts

## 2012-01-21 IMAGING — CR DG CHEST 2V
2 series · 2 of 2 positions shown · non-contrast
Comparison: 06/30/2010

CLINICAL DATA: Mid chest pain with cough.  Ex-smoker with cessation
1 month prior.  History of MRI with cardiac stent

CHEST - 2 VIEW

[w chest pa]
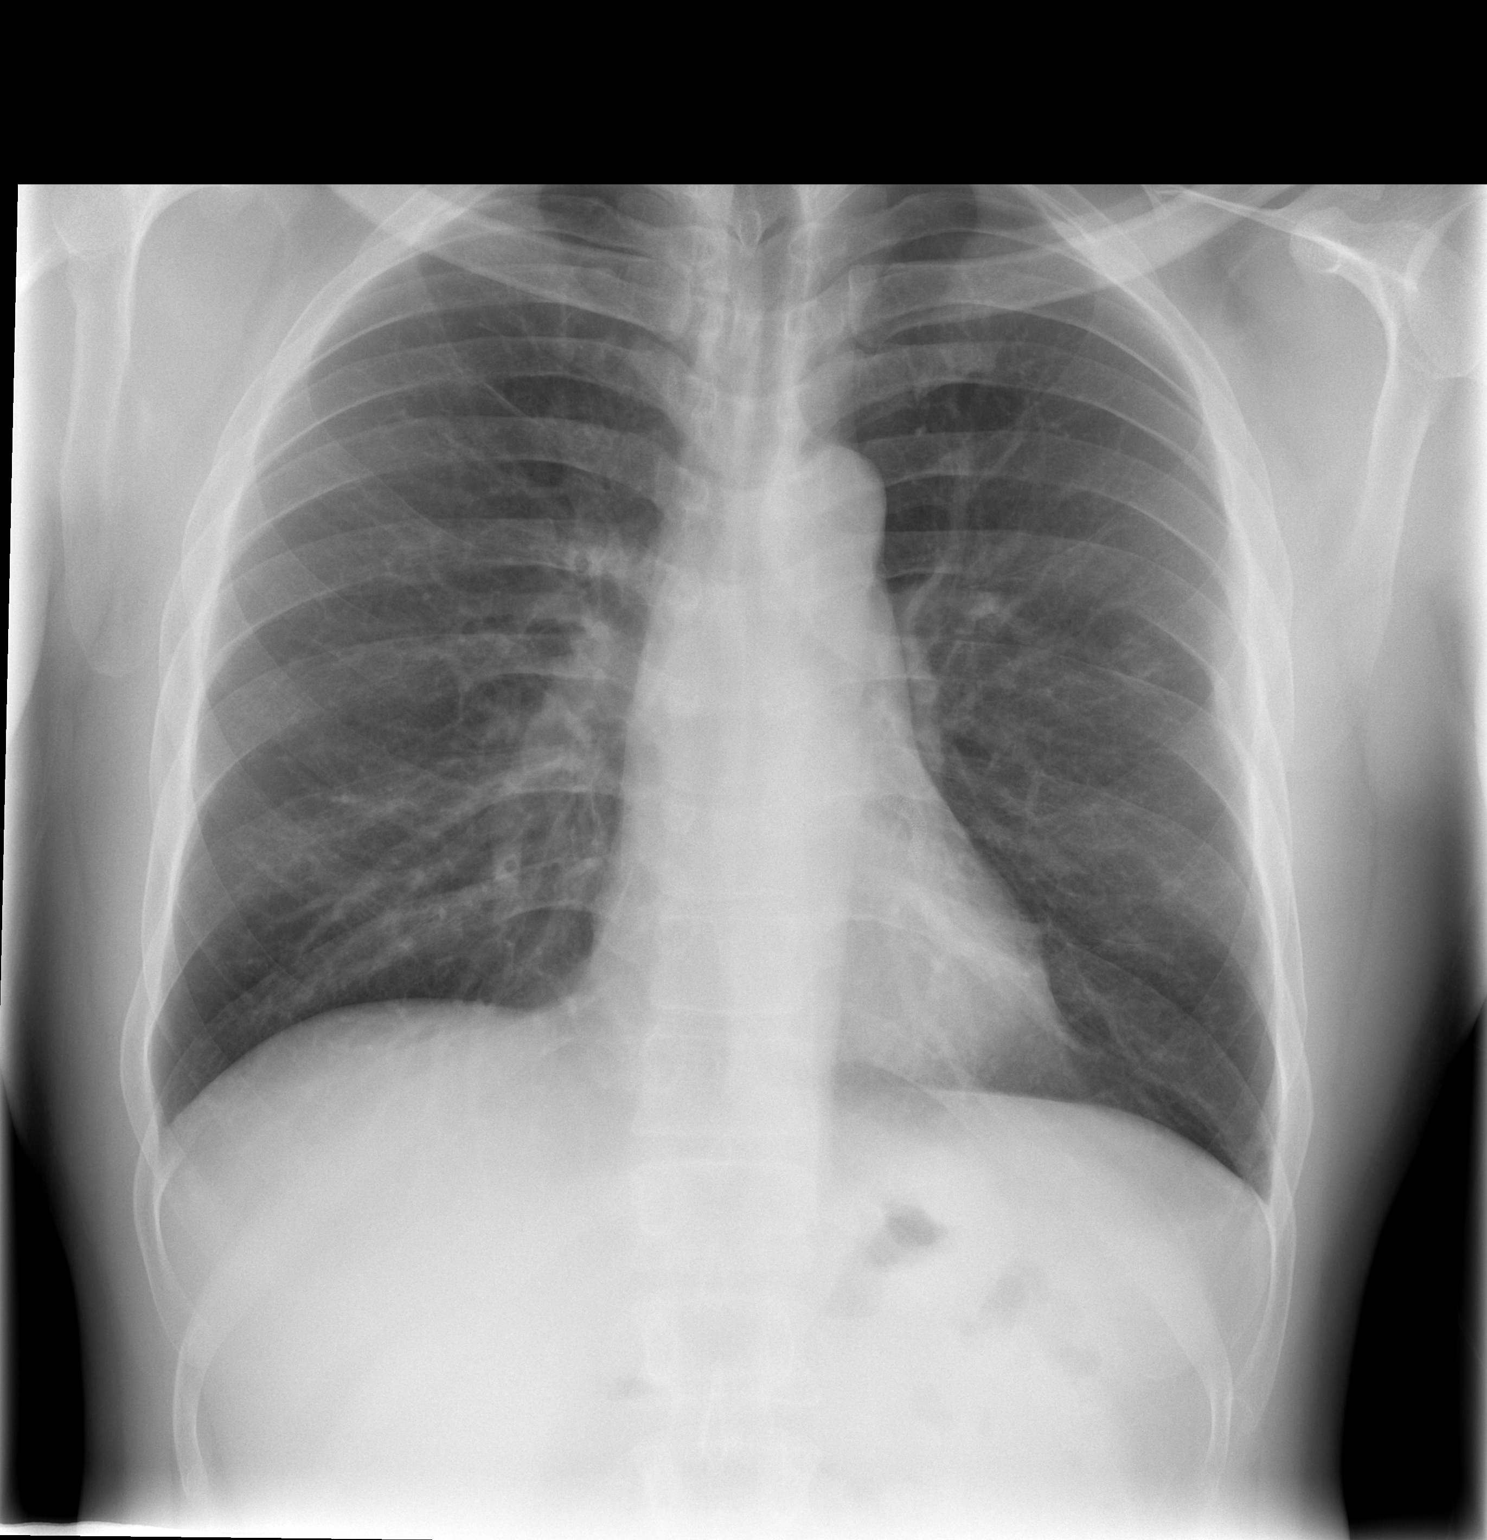

[w chest lat]
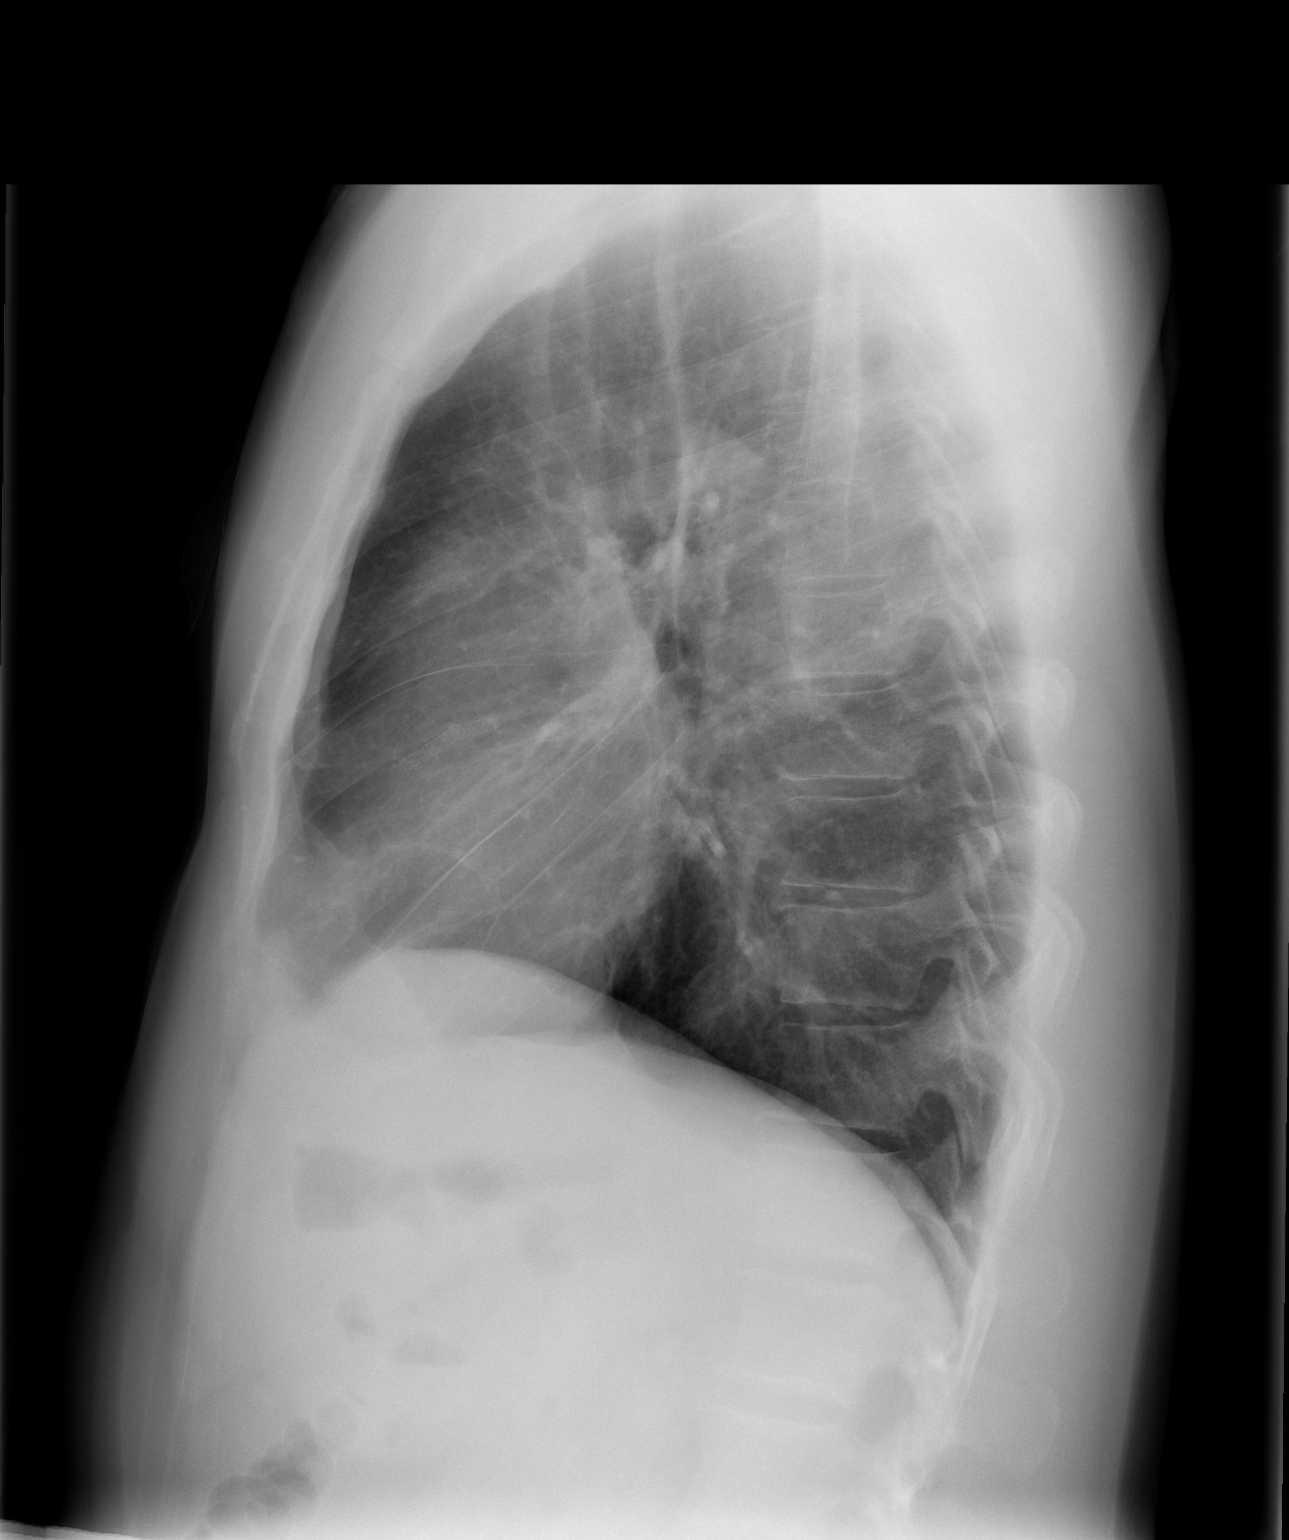

[2 of 2 positions shown; findings below may reference images not displayed]

FINDINGS: Heart and mediastinal contours are within normal limits.
The lung fields appear clear with no signs of focal infiltrate or
congestive failure.  No pleural fluid or peribronchial cuffing is
seen.  No signs of fissural prominence or interstitial septal lines
are seen to suggest early interstitial edema.

Bony structures are notable for a healed fracture of the lateral
aspect of the left seventh rib which is unchanged.
IMPRESSION: Stable cardiopulmonary appearance with no new focal or acute
abnormality identified.

## 2013-02-13 ENCOUNTER — Other Ambulatory Visit: Payer: Self-pay | Admitting: Interventional Cardiology

## 2013-02-14 NOTE — Telephone Encounter (Signed)
To [name]

## 2013-02-22 ENCOUNTER — Other Ambulatory Visit: Payer: Self-pay

## 2013-02-22 MED ORDER — NITROGLYCERIN 0.4 MG SL SUBL: 0.4000 mg | SUBLINGUAL_TABLET | SUBLINGUAL | Status: DC | PRN

## 2013-03-28 DIAGNOSIS — I219 Acute myocardial infarction, unspecified: Secondary | ICD-10-CM

## 2013-03-28 HISTORY — DX: Acute myocardial infarction, unspecified: I21.9

## 2013-06-20 ENCOUNTER — Encounter (HOSPITAL_COMMUNITY): Payer: Self-pay | Admitting: Emergency Medicine

## 2013-06-20 ENCOUNTER — Other Ambulatory Visit: Payer: Self-pay

## 2013-06-20 ENCOUNTER — Inpatient Hospital Stay (HOSPITAL_COMMUNITY)
Admission: EM | Admit: 2013-06-20 | Discharge: 2013-06-21 | DRG: 247 | Disposition: A | Discharge status: Home / Self Care | Payer: BC Managed Care – PPO | Attending: Interventional Cardiology | Admitting: Interventional Cardiology

## 2013-06-20 ENCOUNTER — Emergency Department (HOSPITAL_COMMUNITY): Payer: BC Managed Care – PPO

## 2013-06-20 ENCOUNTER — Encounter (HOSPITAL_COMMUNITY)
Admission: EM | Disposition: A | Discharge status: Home / Self Care | Payer: BC Managed Care – PPO | Source: Home / Self Care | Attending: Internal Medicine

## 2013-06-20 DIAGNOSIS — Z87891 Personal history of nicotine dependence: Secondary | ICD-10-CM

## 2013-06-20 DIAGNOSIS — I498 Other specified cardiac arrhythmias: Secondary | ICD-10-CM | POA: Diagnosis present

## 2013-06-20 DIAGNOSIS — I214 Non-ST elevation (NSTEMI) myocardial infarction: Principal | ICD-10-CM

## 2013-06-20 DIAGNOSIS — Z955 Presence of coronary angioplasty implant and graft: Secondary | ICD-10-CM

## 2013-06-20 DIAGNOSIS — Z9119 Patient's noncompliance with other medical treatment and regimen: Secondary | ICD-10-CM

## 2013-06-20 DIAGNOSIS — I209 Angina pectoris, unspecified: Secondary | ICD-10-CM

## 2013-06-20 DIAGNOSIS — Z9861 Coronary angioplasty status: Secondary | ICD-10-CM

## 2013-06-20 DIAGNOSIS — R7309 Other abnormal glucose: Secondary | ICD-10-CM | POA: Diagnosis present

## 2013-06-20 DIAGNOSIS — E7800 Pure hypercholesterolemia, unspecified: Secondary | ICD-10-CM | POA: Diagnosis present

## 2013-06-20 DIAGNOSIS — E785 Hyperlipidemia, unspecified: Secondary | ICD-10-CM | POA: Diagnosis present

## 2013-06-20 DIAGNOSIS — F411 Generalized anxiety disorder: Secondary | ICD-10-CM | POA: Diagnosis present

## 2013-06-20 DIAGNOSIS — R079 Chest pain, unspecified: Secondary | ICD-10-CM

## 2013-06-20 DIAGNOSIS — Z91199 Patient's noncompliance with other medical treatment and regimen due to unspecified reason: Secondary | ICD-10-CM

## 2013-06-20 DIAGNOSIS — Y831 Surgical operation with implant of artificial internal device as the cause of abnormal reaction of the patient, or of later complication, without mention of misadventure at the time of the procedure: Secondary | ICD-10-CM | POA: Diagnosis present

## 2013-06-20 DIAGNOSIS — I200 Unstable angina: Secondary | ICD-10-CM | POA: Insufficient documentation

## 2013-06-20 DIAGNOSIS — T82897A Other specified complication of cardiac prosthetic devices, implants and grafts, initial encounter: Secondary | ICD-10-CM | POA: Diagnosis present

## 2013-06-20 DIAGNOSIS — I2510 Atherosclerotic heart disease of native coronary artery without angina pectoris: Secondary | ICD-10-CM | POA: Diagnosis present

## 2013-06-20 DIAGNOSIS — R072 Precordial pain: Secondary | ICD-10-CM | POA: Diagnosis present

## 2013-06-20 DIAGNOSIS — F419 Anxiety disorder, unspecified: Secondary | ICD-10-CM | POA: Diagnosis present

## 2013-06-20 DIAGNOSIS — I10 Essential (primary) hypertension: Secondary | ICD-10-CM | POA: Diagnosis present

## 2013-06-20 HISTORY — DX: Anxiety disorder, unspecified: F41.9

## 2013-06-20 HISTORY — PX: LEFT HEART CATHETERIZATION WITH CORONARY ANGIOGRAM: SHX5451

## 2013-06-20 HISTORY — DX: Hyperlipidemia, unspecified: E78.5

## 2013-06-20 LAB — CREATININE, SERUM
Creatinine, Ser: 0.91 mg/dL (ref 0.50–1.35)
GFR calc Af Amer: >90 mL/min (ref >90)

## 2013-06-20 LAB — I-STAT TROPONIN, ED: Troponin i, poc: 0.03 ng/mL (ref 0.00–0.08)

## 2013-06-20 LAB — CBC
HCT: 46.2 % (ref 39.0–52.0)
HEMATOCRIT: 46.9 % (ref 39.0–52.0)
HEMOGLOBIN: 15.9 g/dL (ref 13.0–17.0)
HEMOGLOBIN: 15.7 g/dL (ref 13.0–17.0)
MCH: 30.6 pg (ref 26.0–34.0)
MCH: 30.5 pg (ref 26.0–34.0)
MCHC: 33.9 g/dL (ref 30.0–36.0)
MCHC: 34 g/dL (ref 30.0–36.0)
MCV: 90.2 fL (ref 78.0–100.0)
MCV: 89.7 fL (ref 78.0–100.0)
Platelets: 274 10*3/uL (ref 150–400)
Platelets: 240 10*3/uL (ref 150–400)
RBC: 5.2 MIL/uL (ref 4.22–5.81)
RBC: 5.15 MIL/uL (ref 4.22–5.81)
RDW: 14.2 % (ref 11.5–15.5)
RDW: 14.2 % (ref 11.5–15.5)
WBC: 7.8 10*3/uL (ref 4.0–10.5)
WBC: 6.1 10*3/uL (ref 4.0–10.5)

## 2013-06-20 LAB — APTT: aPTT: 31 seconds (ref 24–37)

## 2013-06-20 LAB — HEMOGLOBIN A1C
Hgb A1c MFr Bld: 6.4 % — ABNORMAL HIGH (ref <5.7)
MEAN PLASMA GLUCOSE: 137 mg/dL — AB (ref <117)

## 2013-06-20 LAB — POCT ACTIVATED CLOTTING TIME
ACTIVATED CLOTTING TIME: 387 s
Activated Clotting Time: 298 seconds

## 2013-06-20 LAB — BASIC METABOLIC PANEL
BUN: 10 mg/dL (ref 6–23)
CALCIUM: 9.3 mg/dL (ref 8.4–10.5)
CO2: 24 meq/L (ref 19–32)
CREATININE: 1 mg/dL (ref 0.50–1.35)
Chloride: 101 mEq/L (ref 96–112)
GFR calc Af Amer: >90 mL/min (ref >90)
GFR, EST NON AFRICAN AMERICAN: 88 mL/min — AB (ref >90)
GLUCOSE: 127 mg/dL — AB (ref 70–99)
Potassium: 3.6 mEq/L — ABNORMAL LOW (ref 3.7–5.3)
SODIUM: 138 meq/L (ref 137–147)

## 2013-06-20 LAB — TROPONIN I: TROPONIN I: 0.48 ng/mL — AB (ref <0.30)

## 2013-06-20 LAB — LIPID PANEL
CHOLESTEROL: 240 mg/dL — AB (ref 0–200)
HDL: 30 mg/dL — ABNORMAL LOW (ref >39)
LDL Cholesterol: 168 mg/dL — ABNORMAL HIGH (ref 0–99)
Total CHOL/HDL Ratio: 8 RATIO
Triglycerides: 210 mg/dL — ABNORMAL HIGH (ref <150)
VLDL: 42 mg/dL — AB (ref 0–40)

## 2013-06-20 LAB — PROTIME-INR
INR: 1.02 (ref 0.00–1.49)
Prothrombin Time: 13.2 seconds (ref 11.6–15.2)

## 2013-06-20 SURGERY — LEFT HEART CATHETERIZATION WITH CORONARY ANGIOGRAM
Anesthesia: LOCAL

## 2013-06-20 MED ORDER — TICAGRELOR 90 MG PO TABS
90.0000 mg | ORAL_TABLET | Freq: Two times a day (BID) | ORAL | Status: DC
  Administered 2013-06-20 – 2013-06-21 (×2): 90 mg via ORAL
  Filled 2013-06-20 (×3): qty 1

## 2013-06-20 MED ORDER — HEPARIN SODIUM (PORCINE) 1000 UNIT/ML IJ SOLN
INTRAMUSCULAR | Status: AC
  Filled 2013-06-20: qty 1

## 2013-06-20 MED ORDER — OXYCODONE-ACETAMINOPHEN 5-325 MG PO TABS: 1.0000 | ORAL_TABLET | ORAL | Status: DC | PRN

## 2013-06-20 MED ORDER — LIDOCAINE HCL (PF) 1 % IJ SOLN
INTRAMUSCULAR | Status: AC
  Filled 2013-06-20: qty 30

## 2013-06-20 MED ORDER — ATORVASTATIN CALCIUM 80 MG PO TABS
80.0000 mg | ORAL_TABLET | Freq: Every day | ORAL | Status: DC
Start: 2013-06-20 — End: 2013-06-21
  Administered 2013-06-20: 80 mg via ORAL
  Filled 2013-06-20 (×2): qty 1

## 2013-06-20 MED ORDER — ENOXAPARIN SODIUM 40 MG/0.4ML ~~LOC~~ SOLN
40.0000 mg | SUBCUTANEOUS | Status: DC
  Filled 2013-06-20 (×2): qty 0.4

## 2013-06-20 MED ORDER — SODIUM CHLORIDE 0.9 % IV SOLN
1.0000 mL/kg/h | INTRAVENOUS | Status: DC
  Administered 2013-06-20: 1 mL/kg/h via INTRAVENOUS

## 2013-06-20 MED ORDER — ACTIVE PARTNERSHIP FOR HEALTH OF YOUR HEART BOOK
Freq: Once | Status: AC
  Administered 2013-06-20: 20:00:00
  Filled 2013-06-20: qty 1

## 2013-06-20 MED ORDER — NITROGLYCERIN 0.2 MG/ML ON CALL CATH LAB
INTRAVENOUS | Status: AC
  Filled 2013-06-20: qty 1

## 2013-06-20 MED ORDER — SODIUM CHLORIDE 0.9 % IV SOLN: 250.0000 mL | INTRAVENOUS | Status: DC | PRN

## 2013-06-20 MED ORDER — PANTOPRAZOLE SODIUM 40 MG PO TBEC
40.0000 mg | DELAYED_RELEASE_TABLET | Freq: Every day | ORAL | Status: DC
  Administered 2013-06-20 – 2013-06-21 (×2): 40 mg via ORAL
  Filled 2013-06-20 (×2): qty 1

## 2013-06-20 MED ORDER — ACETAMINOPHEN 325 MG PO TABS
650.0000 mg | ORAL_TABLET | Freq: Four times a day (QID) | ORAL | Status: DC | PRN
Start: 2013-06-20 — End: 2013-06-20

## 2013-06-20 MED ORDER — DIAZEPAM 5 MG PO TABS
5.0000 mg | ORAL_TABLET | ORAL | Status: AC
Start: 2013-06-20 — End: 2013-06-20
  Administered 2013-06-20: 5 mg via ORAL
  Filled 2013-06-20: qty 1

## 2013-06-20 MED ORDER — TICAGRELOR 90 MG PO TABS
ORAL_TABLET | ORAL | Status: AC
  Administered 2013-06-20: 21:00:00 90 mg via ORAL
  Filled 2013-06-20: qty 1

## 2013-06-20 MED ORDER — HEPARIN (PORCINE) IN NACL 2-0.9 UNIT/ML-% IJ SOLN
INTRAMUSCULAR | Status: AC
  Filled 2013-06-20: qty 1500

## 2013-06-20 MED ORDER — TICAGRELOR 90 MG PO TABS
ORAL_TABLET | ORAL | Status: AC
  Filled 2013-06-20: qty 1

## 2013-06-20 MED ORDER — MIDAZOLAM HCL 2 MG/2ML IJ SOLN
INTRAMUSCULAR | Status: AC
  Filled 2013-06-20: qty 2

## 2013-06-20 MED ORDER — ASPIRIN 325 MG PO TABS
325.0000 mg | ORAL_TABLET | Freq: Every day | ORAL | Status: DC
  Filled 2013-06-20: qty 1

## 2013-06-20 MED ORDER — ASPIRIN 81 MG PO CHEW
81.0000 mg | CHEWABLE_TABLET | ORAL | Status: DC
  Filled 2013-06-20: qty 1

## 2013-06-20 MED ORDER — SODIUM CHLORIDE 0.9 % IV SOLN
INTRAVENOUS | Status: DC
  Administered 2013-06-20: 08:00:00 via INTRAVENOUS

## 2013-06-20 MED ORDER — METOPROLOL TARTRATE 12.5 MG HALF TABLET
12.5000 mg | ORAL_TABLET | Freq: Two times a day (BID) | ORAL | Status: DC
  Administered 2013-06-20 – 2013-06-21 (×3): 12.5 mg via ORAL
  Filled 2013-06-20 (×5): qty 1

## 2013-06-20 MED ORDER — ONDANSETRON HCL 4 MG PO TABS: 4.0000 mg | ORAL_TABLET | Freq: Four times a day (QID) | ORAL | Status: DC | PRN

## 2013-06-20 MED ORDER — ASPIRIN 81 MG PO CHEW
81.0000 mg | CHEWABLE_TABLET | Freq: Every day | ORAL | Status: DC
  Administered 2013-06-21: 11:00:00 81 mg via ORAL
  Filled 2013-06-20: qty 1

## 2013-06-20 MED ORDER — ACETAMINOPHEN 650 MG RE SUPP: 650.0000 mg | Freq: Four times a day (QID) | RECTAL | Status: DC | PRN

## 2013-06-20 MED ORDER — HEPARIN (PORCINE) IN NACL 100-0.45 UNIT/ML-% IJ SOLN
1200.0000 [IU]/h | INTRAMUSCULAR | Status: DC
  Administered 2013-06-20: 1200 [IU]/h via INTRAVENOUS
  Filled 2013-06-20: qty 250

## 2013-06-20 MED ORDER — SODIUM CHLORIDE 0.9 % IJ SOLN
3.0000 mL | Freq: Two times a day (BID) | INTRAMUSCULAR | Status: DC
  Administered 2013-06-20: 3 mL via INTRAVENOUS

## 2013-06-20 MED ORDER — FENTANYL CITRATE 0.05 MG/ML IJ SOLN
INTRAMUSCULAR | Status: AC
  Filled 2013-06-20: qty 2

## 2013-06-20 MED ORDER — HEPARIN BOLUS VIA INFUSION
4000.0000 [IU] | Freq: Once | INTRAVENOUS | Status: AC
  Administered 2013-06-20: 4000 [IU] via INTRAVENOUS
  Filled 2013-06-20: qty 4000

## 2013-06-20 MED ORDER — NITROGLYCERIN 0.4 MG SL SUBL: 0.4000 mg | SUBLINGUAL_TABLET | SUBLINGUAL | Status: DC | PRN

## 2013-06-20 MED ORDER — SODIUM CHLORIDE 0.9 % IV SOLN
INTRAVENOUS | Status: AC
  Administered 2013-06-20: 150 mL/h via INTRAVENOUS

## 2013-06-20 MED ORDER — SODIUM CHLORIDE 0.9 % IJ SOLN: 3.0000 mL | INTRAMUSCULAR | Status: DC | PRN

## 2013-06-20 MED ORDER — VERAPAMIL HCL 2.5 MG/ML IV SOLN
INTRAVENOUS | Status: AC
  Filled 2013-06-20: qty 2

## 2013-06-20 MED ORDER — ONDANSETRON HCL 4 MG/2ML IJ SOLN: 4.0000 mg | Freq: Four times a day (QID) | INTRAMUSCULAR | Status: DC | PRN

## 2013-06-20 NOTE — ED Notes (Signed)
Pt up to and back from b/r, steady gait, (denies: sob, nausea, dizziness, sob or other sx). Family arrives to [facility]. Pending lab results and pCXR.

## 2013-06-20 NOTE — Consult Note (Signed)
CARDIOLOGY CONSULT NOTE   Patient ID: Jacob [name] MRN: [id number], DOB/AGE: 47-Nov-1967   Admit date: 06/20/2013 Date of Consult: 06/20/2013   Primary Physician: Lesleigh Noe, MD Primary Cardiologist: Dr. Verdis Prime  Pt. Profile  48 year old gentleman admitted with left-sided chest discomfort.  Patient has a history of known ischemic heart disease.  Problem List  Past Medical History  Diagnosis Date  . Coronary artery disease   . Hypertension   . Anxiety disorder   . Hyperlipidemia     Past Surgical History  Procedure Laterality Date  . Coronary stent placement       Allergies  No Known Allergies  HPI    this 48 year old gentleman has a history of previous ischemic heart disease.  He had a history of an acute inferior infarction in 2010 and in 2012 with stent placement.  He has stopped taking all of his cardiac medications.  He has not been taking any daily aspirin.  He has been in his usual state of health until yesterday when at work he developed left arm discomfort and left chest discomfort.  There was no diaphoresis or significant dyspnea.  He thought that it might be indigestion from eating bad food.  He works second shift.  After he got off work he was still experiencing the discomfort and drank a beer, thinking that if he could that the pain would resolve but it did not.  He went home and lie down on the couch.  The pain persisted.  He then went in and told his wife and they called 911 he came to the emergency room.  This morning the patient is pain-free.  Inpatient Medications  . aspirin  325 mg Oral Daily  . enoxaparin (LOVENOX) injection  40 mg Subcutaneous Q24H  . pantoprazole  40 mg Oral Daily    Family History No family history on file.   Social History History   Social History  . Marital Status: Married    Spouse Name: N/A    Number of Children: N/A  . Years of Education: N/A   Occupational History  . Not on file.   Social History Main  Topics  . Smoking status: Former Smoker    Quit date: 09/24/2010  . Smokeless tobacco: Not on file  . Alcohol Use: Yes  . Drug Use: No  . Sexual Activity: Not on file   Other Topics Concern  . Not on file   Social History Narrative  . No narrative on file     Review of Systems  General:  No chills, fever, night sweats or weight changes.  Cardiovascular:  No chest pain, dyspnea on exertion, edema, orthopnea, palpitations, paroxysmal nocturnal dyspnea. Dermatological: No rash, lesions/masses Respiratory: No cough, dyspnea Urologic: No hematuria, dysuria Abdominal:   No nausea, vomiting, diarrhea, bright red blood per rectum, melena, or hematemesis Neurologic:  No visual changes, wkns, changes in mental status. All other systems reviewed and are otherwise negative except as noted above.  Physical Exam  Blood pressure 129/80, pulse 95, temperature 98.7 F (37.1 C), temperature source Oral, resp. rate 13, height 5\' 8"  (1.727 m), weight 210 lb (95.255 kg), SpO2 99.00%.  General: Pleasant, NAD Psych: Normal affect. Neuro: Alert and oriented X 3. Moves all extremities spontaneously. HEENT: Normal  Neck: Supple without bruits or JVD. Lungs:  Resp regular and unlabored, CTA. Heart: RRR no s3, s4, or murmurs. Abdomen: Soft, non-tender, non-distended, BS + x 4.  Extremities: No clubbing, cyanosis or edema. DP/PT/Radials  2+ and equal bilaterally.  Labs  No results found for this basename: CKTOTAL, CKMB, TROPONINI,  in the last 72 hours Lab Results  Component Value Date   WBC 7.8 06/20/2013   HGB 15.9 06/20/2013   HCT 46.9 06/20/2013   MCV 90.2 06/20/2013   PLT 274 06/20/2013     Recent Labs Lab 06/20/13 0325  NA 138  K 3.6*  CL 101  CO2 24  BUN 10  CREATININE 1.00  CALCIUM 9.3  GLUCOSE 127*   Lab Results  Component Value Date   CHOL  Value: 186        ATP III CLASSIFICATION:  <200     mg/dL   Desirable  025-852  mg/dL   Borderline High  >=778    mg/dL   High         05/02/2351   HDL 27* 06/28/2010   LDLCALC  Value: 128        Total Cholesterol/HDL:CHD Risk Coronary Heart Disease Risk Table                     Men   Women  1/2 Average Risk   3.4   3.3  Average Risk       5.0   4.4  2 X Average Risk   9.6   7.1  3 X Average Risk  23.4   11.0        Use the calculated Patient Ratio above and the CHD Risk Table to determine the patient's CHD Risk.        ATP III CLASSIFICATION (LDL):  <100     mg/dL   Optimal  614-431  mg/dL   Near or Above                    Optimal  130-159  mg/dL   Borderline  540-086  mg/dL   High  >761     mg/dL   Very High* 12/01/930   TRIG 156* 06/28/2010   No results found for this basename: DDIMER    Radiology/Studies  Dg Chest Port 1 View  06/20/2013   CLINICAL DATA:  Left-sided chest pain.  EXAM: PORTABLE CHEST - 1 VIEW  COMPARISON:  Chest x-ray 07/29/2010.  FINDINGS: Lung volumes are normal. No consolidative airspace disease. No pleural effusions. No pneumothorax. No pulmonary nodule or mass noted. Pulmonary vasculature and the cardiomediastinal silhouette are within normal limits.  IMPRESSION: 1.  No radiographic evidence of acute cardiopulmonary disease.   Electronically Signed   By: Trudie Reed M.D.   On: 06/20/2013 03:46    ECG  EKG shows normal sinus rhythm and nonspecific T-wave flattening  ASSESSMENT AND PLAN  1.  Chest pain, possible acute coronary syndrome.  Initial point-of-care troponins negative at 0.03.  EKG shows no acute changes but the chest discomfort is similar although less intense than his previous episodes in 2010 and 2012. 2. Hypercholesterolemia 3. medical noncompliance 4. essential hypertension 5. hyperglycemia rule out diabetes.  Check A1c  Recommendation: Will proceed with left heart cardiac catheterization today.  Discussed with patient who agrees.  He will need extensive education regarding risk factor modification and the importance of remaining on medications long-term  Signed, Cassell Clement,  MD  06/20/2013, 7:51 AM

## 2013-06-20 NOTE — ED Provider Notes (Signed)
CSN: [id number]     Arrival date & time 06/20/13  2947 History   First MD Initiated Contact with Patient 06/20/13 [phone]     Chief Complaint  Patient presents with  . Chest Pain     (Consider location/radiation/quality/duration/timing/severity/associated sxs/prior Treatment) HPI Comments: Pt comes in with cc of chest pain. Pt has history of coronary disease, having presented with an acute inferior infarction in 2010 and 2012 with stent placement. Current chest pain started yday evening while he was at work. Pain is pressure like, left sided, and radiates to the shoulder. Pain was pretty persistent, and finally patient decided to come to the ER for further evaluation. Pt is now chest pain free.  Patient is a 48 y.o. male presenting with chest pain. The history is provided by the patient.  Chest Pain Associated symptoms: no abdominal pain, no cough and no shortness of breath     Past Medical History  Diagnosis Date  . Coronary artery disease   . Hypertension    Past Surgical History  Procedure Laterality Date  . Coronary stent placement     No family history on file. History  Substance Use Topics  . Smoking status: Former Smoker    Quit date: 09/24/2010  . Smokeless tobacco: Not on file  . Alcohol Use: Yes    Review of Systems  Constitutional: Negative for activity change and appetite change.  Respiratory: Negative for cough and shortness of breath.   Cardiovascular: Positive for chest pain.  Gastrointestinal: Negative for abdominal pain.  Genitourinary: Negative for dysuria.  All other systems reviewed and are negative.      Allergies  Review of patient's allergies indicates no known allergies.  Home Medications   Current Outpatient Rx  Name  Route  Sig  Dispense  Refill  . nitroGLYCERIN (NITROSTAT) 0.4 MG SL tablet   Sublingual   Place 1 tablet (0.4 mg total) under the tongue every 5 (five) minutes as needed for chest pain.   25 tablet   3    BP 107/63   Pulse 97  Temp(Src) 98.7 F (37.1 C) (Oral)  Resp 13  Ht 5\' 8"  (1.727 m)  Wt 210 lb (95.255 kg)  BMI 31.94 kg/m2  SpO2 81% Physical Exam  Nursing note and vitals reviewed. Constitutional: He is oriented to person, place, and time. He appears well-developed.  HENT:  Head: Normocephalic and atraumatic.  Eyes: Conjunctivae and EOM are normal. Pupils are equal, round, and reactive to light.  Neck: Normal range of motion. Neck supple.  Cardiovascular: Normal rate and regular rhythm.   Pulmonary/Chest: Effort normal and breath sounds normal.  Abdominal: Soft. Bowel sounds are normal. He exhibits no distension. There is no tenderness. There is no rebound and no guarding.  Neurological: He is alert and oriented to person, place, and time.  Skin: Skin is warm.    ED Course  Procedures (including critical care time) Labs Review Labs Reviewed  BASIC METABOLIC PANEL - Abnormal; Notable for the following:    Potassium 3.6 (*)    Glucose, Bld 127 (*)    GFR calc non Af Amer 88 (*)    All other components within normal limits  CBC  I-STAT TROPOININ, ED   Imaging Review Dg Chest Port 1 View  06/20/2013   CLINICAL DATA:  Left-sided chest pain.  EXAM: PORTABLE CHEST - 1 VIEW  COMPARISON:  Chest x-ray 07/29/2010.  FINDINGS: Lung volumes are normal. No consolidative airspace disease. No pleural effusions. No pneumothorax. No  pulmonary nodule or mass noted. Pulmonary vasculature and the cardiomediastinal silhouette are within normal limits.  IMPRESSION: 1.  No radiographic evidence of acute cardiopulmonary disease.   Electronically Signed   By: Trudie Reed M.D.   On: 06/20/2013 03:46     EKG Interpretation   Date/Time:  Thursday June 20 2013 03:17:35 EDT Ventricular Rate:  102 PR Interval:  178 QRS Duration: 85 QT Interval:  377 QTC Calculation: 491 R Axis:   80 Text Interpretation:  Age not entered, assumed to be  48 years old for  purpose of ECG interpretation Sinus tachycardia  Borderline T  abnormalities, lateral leads Borderline prolonged QT interval Confirmed by  Rhunette Croft, MD, Janey Genta [phone]) on 06/20/2013 5:55:08 AM      MDM   Final diagnoses:  None    Differential diagnosis includes: ACS syndrome CHF exacerbation Valvular disorder Myocarditis Pericarditis Pericardial effusion Pneumonia Pleural effusion Pulmonary edema PE Anemia Musculoskeletal pain  Pt comes in with cc of chest pain. Patient is having left sided chest pain - radiating to the shoulder. Hx of CAD, not taking ASA as he should. Will admit. Chest pain free at this time. Full dose ASA given by EMS.  Derwood Kaplan, MD 06/20/13 [phone]

## 2013-06-20 NOTE — ED Notes (Addendum)
Pt sleeping, SPO2 drops when sleeping, wife at [facility] reports OSA is not dx'd, but sleep & breathing pattern is usual to pt, pt snoring at this time.

## 2013-06-20 NOTE — Interval H&P Note (Signed)
Cath Lab Visit (complete for each Cath Lab visit)  Clinical Evaluation Leading to the Procedure:   ACS: yes  Non-ACS:    Anginal Classification: CCS IV  Anti-ischemic medical therapy: Minimal Therapy (1 class of medications)  Non-Invasive Test Results: No non-invasive testing performed  Prior CABG: No previous CABG      History and Physical Interval Note:  06/20/2013 12:42 PM  Lubertha Sayres  has presented today for surgery, with the diagnosis of cp  The various methods of treatment have been discussed with the patient and family. After consideration of risks, benefits and other options for treatment, the patient has consented to  Procedure(s): LEFT HEART CATHETERIZATION WITH CORONARY ANGIOGRAM (N/A) as a surgical intervention .  The patient's history has been reviewed, patient examined, no change in status, stable for surgery.  I have reviewed the patient's chart and labs.  Questions were answered to the patient's satisfaction.     Lesleigh Noe

## 2013-06-20 NOTE — CV Procedure (Signed)
Left Heart Catheterization with Coronary Angiography and PCI Report  Jacob [name]  48 y.o.  male Aug 01, 1965  Procedure Date: 06/20/2013  Referring Physician: Ronny Flurry, M.D. Primary Cardiologist:: Gwynneth Albright, M.D.  INDICATIONS: Acute coronary syndrome with prolonged chest pain occurring at rest and radiating into the left arm associated with elevated cardiac markers but no acute EKG changes. Prior history of the LAD and right coronary stents in the setting of acute coronary syndrome. Most recently 2011.  PROCEDURE: 1. Left heart catheterization; 2. Coronary angiography; 3. Left ventriculography; 4. PCI RCA with DES x2  CONSENT:  The risks, benefits, and details of the procedure were explained in detail to the patient. Risks including death, stroke, heart attack, kidney injury, allergy, limb ischemia, bleeding and radiation injury were discussed.  The patient verbalized understanding and wanted to proceed.  Informed written consent was obtained.  PROCEDURE TECHNIQUE:  After Xylocaine anesthesia a 5 French slender sheath was placed in the right radial artery with a single anterior needle wall stick.  Coronary angiography was done using a 5 [place] JR 4 and JL 3.5 cm diagnostic catheters.  Left ventriculography was done using the 5 [place] JR 4 catheter and hand injection.   Review of the images demonstrated high-grade obstruction in the distal right coronary proximal to the previously placed stent and also a new severe stenosis in the mid vessel. The LAD stent and circumflex were widely patent.  A total of 9500 units of heparin was administered (5000 units after gaining radial access and in other 4500 units after the decision to perform PCI was made. The ACT was documented to be390 seconds. Brilinta 180 mg was administered orally. An AL 0.75 cm 6 [place] guide catheter was used to obtain guiding shots. A standard 6 [place] JR 4 had only intermittent cannulation of the right  coronary. After obtaining adequate guiding shots we proceeded with PCI. We use a Pro-water guidewire.  A 3.0 x 15 mm long balloon was used to predilate the distal and the mid right coronary stenosis. We then positioned and deployed a 3.0 x 16 mm Promus Premier. He was deployed at 15 atmospheres. 2 balloon inflations were performed. We then positioned and deployed a 4.0 x 20 mm long Promus Premier in the mid stenosis. The stent was deployed at 16 atmospheres. We used a 3.25 x 12 mm long [place] balloon to post dilate the distal stent to 16 atmospheres. We then used a 4.5 x 15 mm [place] balloon to post dilate the proximal stent. The peak pressure was 15 atmospheres. 200 mcg of intracoronary nitroglycerin was administered. The final result was felt to be acceptable.  After the final angiographic images were obtained. The radial sheath was removed and a wrist band was applied to achieve hemostasis.  MEDICATIONS: 2 mg of IV Versed and 50 mcg of fentanyl. 3 mg of IV verapamil was given intra-arterially.  CONTRAST:  Total of 180 cc.  COMPLICATIONS:  None   HEMODYNAMICS:  Aortic pressure 111/78 mmHg; LV pressure 133/6 mmHg; LVEDP 13 mm mercury  ANGIOGRAPHIC DATA:   The left main coronary artery is widely patent and relatively short.  The left anterior descending artery is widely patent. The mid vessel contains diffuse 50% narrowing..  The left circumflex artery is large and widely patent.  The right coronary artery is large and severely diseased with a mid vessel greater than 90% stenosis and distal in-stent restenosis of 70%, eccentric.Marland Kitchen  PCI RESULTS: The mid  and distal right coronary were reduced from greater than 90% to 0% and eccentric 70% to 0% after stenting as identified above. TIMI grade 3 flow was noted post procedure. Final mid stent postdilatation diameter was 4.5 mm and distal stent diameter 3.25 mm.  LEFT VENTRICULOGRAM:  Left ventricular angiogram was done in the 30 RAO projection and revealed  inferior and anterior hypokinesis with EF 55%.   IMPRESSIONS:  1. Acute coronary syndrome with high-grade mid stenosis and moderate to severe stenosis distally of the right coronary.  2. Successful implantation of DES in the mid right coronary reducing the stenosis from 90% to 0% and in the distal right coronary reducing a 70% eccentric stenosis to 0%.  3. Widely patent LAD stent and distal right coronary stents that were placed previously  4. Circumflex with luminal irregularities  5. Low normal left ventricular systolic function   RECOMMENDATION:  Aspirin and Brilinta for at least one year (May use Plavix if there is a financial concern).  Eligible for discharge in a.m. if no clinical complications.

## 2013-06-20 NOTE — Progress Notes (Signed)
CRITICAL VALUE ALERT  Critical value received:  troponins 0.48  Date of notification:  06-20-2013  Time of notification:  1040  Critical value read back: yes  Nurse who received alert:  Christena Deem, RN  MD notified (1st page):  Dr. York Spaniel  Time of first page:  1044  MD notified (2nd page):  Time of second page:  Responding MD:  Dr. York Spaniel  Time MD responded:  1045  New orders recieved

## 2013-06-20 NOTE — ED Notes (Signed)
Alert, NAD, calm, interactive, skin W&D, (denies: pain, sob, nausea, dizziness or other sx), wife has left BS, pt updated. Pending report to 3W 28.

## 2013-06-20 NOTE — Progress Notes (Signed)
ANTICOAGULATION CONSULT NOTE - Initial Consult  Pharmacy Consult for heparin Indication: chest pain/ACS  No Known Allergies  Patient Measurements: Height: 5\' 8"  (172.7 cm) Weight: 210 lb (95.255 kg) IBW/kg (Calculated) : 68.4 Heparin Dosing Weight: 88.4 kg  Vital Signs: Temp: 98.3 F (36.8 C) (03/26 0753) Temp src: Oral (03/26 0753) BP: 125/77 mmHg (03/26 0753) Pulse Rate: 103 (03/26 0753)  Labs:  Recent Labs  06/20/13 0325 06/20/13 0940 06/20/13 0941  HGB 15.9 15.7  --   HCT 46.9 46.2  --   PLT 274 240  --   APTT  --  31  --   LABPROT  --  13.2  --   INR  --  1.02  --   CREATININE 1.00 0.91  --   TROPONINI  --   --  0.48*    Estimated Creatinine Clearance: 112.4 ml/min (by C-G formula based on Cr of 0.91).   Medical History: Past Medical History  Diagnosis Date  . Coronary artery disease   . Hypertension   . Anxiety disorder   . Hyperlipidemia     Medications:  Prescriptions prior to admission  Medication Sig Dispense Refill  . nitroGLYCERIN (NITROSTAT) 0.4 MG SL tablet Place 1 tablet (0.4 mg total) under the tongue every 5 (five) minutes as needed for chest pain.  25 tablet  3    Assessment: 48 y/o male with a history of CAD who presents to the ED with CP. Initial troponin was negative however second is 0.48. Plan is for cath today. Pharmacy consulted to begin heparin. SCr and CBC is normal.  Goal of Therapy:  Heparin level 0.3-0.7 units/ml Monitor platelets by anticoagulation protocol: Yes   Plan:  - Heparin 4000 units IV bolus then infuse at 1200 units/hr - 6 hr heparin level - Daily heparin level and CBC - Monitor for s/sx of bleeding - f/u after cath  [facility], Pharm.D., BCPS Clinical Pharmacist Pager: [phone] 06/20/2013 11:06 AM

## 2013-06-20 NOTE — ED Notes (Addendum)
Per EMS, pt states left side chest pain and left arm pain.  Pt styates more pain in left arm than chest.  Pt given 324 ASA and one SL nitro without any pain relief.

## 2013-06-20 NOTE — ED Notes (Signed)
Pt arrives by EMS, alert, NAD, calm, interactive, resps e/u, speaking in clear complete sentences, denies CP, admits to some L arm pain still. Lab at [facility].

## 2013-06-20 NOTE — H&P (Signed)
[facility] History and Physical  DAVELLE ANSELMI ZYY:[id number] DOB: 01/10/1966 DOA: 06/20/2013  Referring physician:  PCP: Lesleigh Noe, MD  Specialists:   Chief Complaint: chest pain   HPI: Jacob [name] is a 48 y.o. male with PMH of CAD s/p PCI x 2 (2010-12) presented with non exertional L sided intermittent chest pain radiating to his L shoulder. Pain developed after eating restaurant food had GI symppoms, vomited developed chest pain; Denies SOB, no palpitations, no diaphoresis, no fever; He is able to walk >21ml without exertional symptoms   Review of Systems: The patient denies anorexia, fever, weight loss,, vision loss, decreased hearing, hoarseness,  syncope, dyspnea on exertion, peripheral edema, balance deficits, hemoptysis, abdominal pain, melena, hematochezia, severe indigestion/heartburn, hematuria, incontinence, genital sores, muscle weakness, suspicious skin lesions, transient blindness, difficulty walking, depression, unusual weight change, abnormal bleeding, enlarged lymph nodes, angioedema, and breast masses.    Past Medical History  Diagnosis Date  . Coronary artery disease   . Hypertension   . Anxiety disorder   . Hyperlipidemia    Past Surgical History  Procedure Laterality Date  . Coronary stent placement     Social History:  reports that he quit smoking about 2 years ago. He does not have any smokeless tobacco history on file. He reports that he drinks alcohol. He reports that he does not use illicit drugs. Home;  where does patient live--home, ALF, SNF? and with whom if at home? Yes;  Can patient participate in ADLs?  No Known Allergies  No family history on file.  (be sure to complete)  Prior to Admission medications   Medication Sig Start Date End Date Taking? Authorizing Provider  nitroGLYCERIN (NITROSTAT) 0.4 MG SL tablet Place 1 tablet (0.4 mg total) under the tongue every 5 (five) minutes as needed for chest pain. 02/22/13   Everette Rank,  MD   Physical Exam: Filed Vitals:   06/20/13 0700  BP: 129/80  Pulse: 95  Temp:   Resp: 13     General:  alert  Eyes: eom-i  ENT: no oral ulcer   Neck: supple   Cardiovascular: s1,s2 rrr  Respiratory: CTA BL  Abdomen: soft, nt,nd   Skin: no rash  Musculoskeletal: no LE edema  Psychiatric: no hallucinations   Neurologic: CN 2-12 intact, motor 5/5 BL  Labs on Admission:  Basic Metabolic Panel:  Recent Labs Lab 06/20/13 0325  NA 138  K 3.6*  CL 101  CO2 24  GLUCOSE 127*  BUN 10  CREATININE 1.00  CALCIUM 9.3   Liver Function Tests: No results found for this basename: AST, ALT, ALKPHOS, BILITOT, PROT, ALBUMIN,  in the last 168 hours No results found for this basename: LIPASE, AMYLASE,  in the last 168 hours No results found for this basename: AMMONIA,  in the last 168 hours CBC:  Recent Labs Lab 06/20/13 0325  WBC 7.8  HGB 15.9  HCT 46.9  MCV 90.2  PLT 274   Cardiac Enzymes: No results found for this basename: CKTOTAL, CKMB, CKMBINDEX, TROPONINI,  in the last 168 hours  BNP (last 3 results) No results found for this basename: PROBNP,  in the last 8760 hours CBG: No results found for this basename: GLUCAP,  in the last 168 hours  Radiological Exams on Admission: Dg Chest Port 1 View  06/20/2013   CLINICAL DATA:  Left-sided chest pain.  EXAM: PORTABLE CHEST - 1 VIEW  COMPARISON:  Chest x-ray 07/29/2010.  FINDINGS: Lung volumes are normal. No  consolidative airspace disease. No pleural effusions. No pneumothorax. No pulmonary nodule or mass noted. Pulmonary vasculature and the cardiomediastinal silhouette are within normal limits.  IMPRESSION: 1.  No radiographic evidence of acute cardiopulmonary disease.   Electronically Signed   By: Trudie Reed M.D.   On: 06/20/2013 03:46    EKG: Independently reviewed. NSR, non specific t wave changes   Assessment/Plan Principal Problem:   Angina pectoris Active Problems:   Chest pain   CAD (coronary  artery disease)  48 y.o. male with PMH of CAD s/p PCI x 2 (2010-12) presented with non exertional L sided intermittent chest pain radiating to his L shoulder  1. Chest pain r/o ACS; ECG, initial troop unremarkable; patient is chest pain free  -started ASA, NTG, monitor serial, trop, ECG; consulted cardiology evaluation LHC; check lipids, HA1C;  -empiric PPI  2. CAD s/p PCI x 2 (2010-12); not on medications at home  -as above;    Cardiology;  if consultant consulted, please document name and whether formally or informally consulted  Code Status: full (must indicate code status--if unknown or must be presumed, indicate so) Family Communication: d/w patient (indicate person spoken with, if applicable, with phone number if by telephone) Disposition Plan: home 24-48 hours  (indicate anticipated LOS)  Time spent: >35 minutes   [name] [facility] Pager [phone]  If 7PM-7AM, please contact night-coverage [email or web address] Password [facility] 06/20/2013, 7:40 AM

## 2013-06-20 NOTE — Progress Notes (Signed)
[facility] PROGRESS NOTE  Jacob [name] GGY:[id number] DOB: 02/21/1966 DOA: 06/20/2013 PCP: Lesleigh Noe, MD  Troponin is elevated, awaiting LHC; start IV heparin   [name]  [facility] Pager [phone]. If 7PM-7AM, please contact night-coverage at [email or web address], password [facility] 06/20/2013, 10:45 AM  LOS: 0 days

## 2013-06-20 NOTE — ED Notes (Signed)
Alert, NAD, calm, interactive, sleeping, easily arousable to voice, wife at [facility], no changes, updated about wait.

## 2013-06-20 NOTE — Progress Notes (Signed)
TR BAND REMOVAL  LOCATION:    right radial  DEFLATED PER PROTOCOL:    yes  TIME BAND OFF / DRESSING APPLIED:    1830   SITE UPON ARRIVAL:    Level 0  SITE AFTER BAND REMOVAL:    Level 0  REVERSE ALLEN'S TEST:     positive  CIRCULATION SENSATION AND MOVEMENT:    Within Normal Limits   yes  COMMENTS:   Tolerated procedure well 

## 2013-06-20 NOTE — H&P (View-Only) (Signed)
CARDIOLOGY CONSULT NOTE   Patient ID: Jacob [name] MRN: [id number], DOB/AGE: 47-Nov-1967   Admit date: 06/20/2013 Date of Consult: 06/20/2013   Primary Physician: Lesleigh Noe, MD Primary Cardiologist: Dr. Verdis Prime  Pt. Profile  48 year old gentleman admitted with left-sided chest discomfort.  Patient has a history of known ischemic heart disease.  Problem List  Past Medical History  Diagnosis Date  . Coronary artery disease   . Hypertension   . Anxiety disorder   . Hyperlipidemia     Past Surgical History  Procedure Laterality Date  . Coronary stent placement       Allergies  No Known Allergies  HPI    this 48 year old gentleman has a history of previous ischemic heart disease.  He had a history of an acute inferior infarction in 2010 and in 2012 with stent placement.  He has stopped taking all of his cardiac medications.  He has not been taking any daily aspirin.  He has been in his usual state of health until yesterday when at work he developed left arm discomfort and left chest discomfort.  There was no diaphoresis or significant dyspnea.  He thought that it might be indigestion from eating bad food.  He works second shift.  After he got off work he was still experiencing the discomfort and drank a beer, thinking that if he could that the pain would resolve but it did not.  He went home and lie down on the couch.  The pain persisted.  He then went in and told his wife and they called 911 he came to the emergency room.  This morning the patient is pain-free.  Inpatient Medications  . aspirin  325 mg Oral Daily  . enoxaparin (LOVENOX) injection  40 mg Subcutaneous Q24H  . pantoprazole  40 mg Oral Daily    Family History No family history on file.   Social History History   Social History  . Marital Status: Married    Spouse Name: N/A    Number of Children: N/A  . Years of Education: N/A   Occupational History  . Not on file.   Social History Main  Topics  . Smoking status: Former Smoker    Quit date: 09/24/2010  . Smokeless tobacco: Not on file  . Alcohol Use: Yes  . Drug Use: No  . Sexual Activity: Not on file   Other Topics Concern  . Not on file   Social History Narrative  . No narrative on file     Review of Systems  General:  No chills, fever, night sweats or weight changes.  Cardiovascular:  No chest pain, dyspnea on exertion, edema, orthopnea, palpitations, paroxysmal nocturnal dyspnea. Dermatological: No rash, lesions/masses Respiratory: No cough, dyspnea Urologic: No hematuria, dysuria Abdominal:   No nausea, vomiting, diarrhea, bright red blood per rectum, melena, or hematemesis Neurologic:  No visual changes, wkns, changes in mental status. All other systems reviewed and are otherwise negative except as noted above.  Physical Exam  Blood pressure 129/80, pulse 95, temperature 98.7 F (37.1 C), temperature source Oral, resp. rate 13, height 5\' 8"  (1.727 m), weight 210 lb (95.255 kg), SpO2 99.00%.  General: Pleasant, NAD Psych: Normal affect. Neuro: Alert and oriented X 3. Moves all extremities spontaneously. HEENT: Normal  Neck: Supple without bruits or JVD. Lungs:  Resp regular and unlabored, CTA. Heart: RRR no s3, s4, or murmurs. Abdomen: Soft, non-tender, non-distended, BS + x 4.  Extremities: No clubbing, cyanosis or edema. DP/PT/Radials  2+ and equal bilaterally.  Labs  No results found for this basename: CKTOTAL, CKMB, TROPONINI,  in the last 72 hours Lab Results  Component Value Date   WBC 7.8 06/20/2013   HGB 15.9 06/20/2013   HCT 46.9 06/20/2013   MCV 90.2 06/20/2013   PLT 274 06/20/2013     Recent Labs Lab 06/20/13 0325  NA 138  K 3.6*  CL 101  CO2 24  BUN 10  CREATININE 1.00  CALCIUM 9.3  GLUCOSE 127*   Lab Results  Component Value Date   CHOL  Value: 186        ATP III CLASSIFICATION:  <200     mg/dL   Desirable  200-239  mg/dL   Borderline High  >=240    mg/dL   High         06/28/2010   HDL 27* 06/28/2010   LDLCALC  Value: 128        Total Cholesterol/HDL:CHD Risk Coronary Heart Disease Risk Table                     Men   Women  1/2 Average Risk   3.4   3.3  Average Risk       5.0   4.4  2 X Average Risk   9.6   7.1  3 X Average Risk  23.4   11.0        Use the calculated Patient Ratio above and the CHD Risk Table to determine the patient's CHD Risk.        ATP III CLASSIFICATION (LDL):  <100     mg/dL   Optimal  100-129  mg/dL   Near or Above                    Optimal  130-159  mg/dL   Borderline  160-189  mg/dL   High  >190     mg/dL   Very High* 06/28/2010   TRIG 156* 06/28/2010   No results found for this basename: DDIMER    Radiology/Studies  Dg Chest Port 1 View  06/20/2013   CLINICAL DATA:  Left-sided chest pain.  EXAM: PORTABLE CHEST - 1 VIEW  COMPARISON:  Chest x-ray 07/29/2010.  FINDINGS: Lung volumes are normal. No consolidative airspace disease. No pleural effusions. No pneumothorax. No pulmonary nodule or mass noted. Pulmonary vasculature and the cardiomediastinal silhouette are within normal limits.  IMPRESSION: 1.  No radiographic evidence of acute cardiopulmonary disease.   Electronically Signed   By: Daniel  Entrikin M.D.   On: 06/20/2013 03:46    ECG  EKG shows normal sinus rhythm and nonspecific T-wave flattening  ASSESSMENT AND PLAN  1.  Chest pain, possible acute coronary syndrome.  Initial point-of-care troponins negative at 0.03.  EKG shows no acute changes but the chest discomfort is similar although less intense than his previous episodes in 2010 and 2012. 2. Hypercholesterolemia 3. medical noncompliance 4. essential hypertension 5. hyperglycemia rule out diabetes.  Check A1c  Recommendation: Will proceed with left heart cardiac catheterization today.  Discussed with patient who agrees.  He will need extensive education regarding risk factor modification and the importance of remaining on medications long-term  Signed, Sencere Symonette,  MD  06/20/2013, 7:51 AM   

## 2013-06-21 ENCOUNTER — Telehealth: Payer: Self-pay | Admitting: Interventional Cardiology

## 2013-06-21 ENCOUNTER — Encounter (HOSPITAL_COMMUNITY): Payer: Self-pay | Admitting: Nurse Practitioner

## 2013-06-21 DIAGNOSIS — I214 Non-ST elevation (NSTEMI) myocardial infarction: Secondary | ICD-10-CM

## 2013-06-21 DIAGNOSIS — F419 Anxiety disorder, unspecified: Secondary | ICD-10-CM | POA: Diagnosis present

## 2013-06-21 DIAGNOSIS — I200 Unstable angina: Secondary | ICD-10-CM | POA: Insufficient documentation

## 2013-06-21 DIAGNOSIS — I10 Essential (primary) hypertension: Secondary | ICD-10-CM | POA: Diagnosis present

## 2013-06-21 DIAGNOSIS — E785 Hyperlipidemia, unspecified: Secondary | ICD-10-CM | POA: Diagnosis present

## 2013-06-21 LAB — CBC
HCT: 46.2 % (ref 39.0–52.0)
HEMOGLOBIN: 15.2 g/dL (ref 13.0–17.0)
MCH: 29.6 pg (ref 26.0–34.0)
MCHC: 32.9 g/dL (ref 30.0–36.0)
MCV: 89.9 fL (ref 78.0–100.0)
PLATELETS: 236 10*3/uL (ref 150–400)
RBC: 5.14 MIL/uL (ref 4.22–5.81)
RDW: 14.3 % (ref 11.5–15.5)
WBC: 6.9 10*3/uL (ref 4.0–10.5)

## 2013-06-21 LAB — BASIC METABOLIC PANEL
BUN: 8 mg/dL (ref 6–23)
CHLORIDE: 102 meq/L (ref 96–112)
CO2: 22 meq/L (ref 19–32)
Calcium: 8.7 mg/dL (ref 8.4–10.5)
Creatinine, Ser: 0.97 mg/dL (ref 0.50–1.35)
GFR calc Af Amer: >90 mL/min (ref >90)
GFR calc non Af Amer: >90 mL/min (ref >90)
GLUCOSE: 109 mg/dL — AB (ref 70–99)
POTASSIUM: 4.1 meq/L (ref 3.7–5.3)
SODIUM: 139 meq/L (ref 137–147)

## 2013-06-21 MED ORDER — LISINOPRIL 2.5 MG PO TABS: 2.5000 mg | ORAL_TABLET | Freq: Every day | ORAL | Status: DC

## 2013-06-21 MED ORDER — TICAGRELOR 90 MG PO TABS: 90.0000 mg | ORAL_TABLET | Freq: Two times a day (BID) | ORAL | Status: DC

## 2013-06-21 MED ORDER — ATORVASTATIN CALCIUM 80 MG PO TABS: 80.0000 mg | ORAL_TABLET | Freq: Every day | ORAL | Status: DC

## 2013-06-21 MED ORDER — METOPROLOL TARTRATE 25 MG PO TABS: 12.5000 mg | ORAL_TABLET | Freq: Two times a day (BID) | ORAL | Status: DC

## 2013-06-21 MED ORDER — METOPROLOL TARTRATE 25 MG PO TABS: 25.0000 mg | ORAL_TABLET | Freq: Two times a day (BID) | ORAL | Status: DC

## 2013-06-21 MED ORDER — ASPIRIN 81 MG PO CHEW: 81.0000 mg | CHEWABLE_TABLET | Freq: Every day | ORAL | Status: DC

## 2013-06-21 NOTE — Progress Notes (Signed)
Patient Name: Jacob [name] Date of Encounter: 06/21/2013   Principal Problem:   NSTEMI (non-ST elevated myocardial infarction) Active Problems:   CAD (coronary artery disease)   Hypertension   Anxiety disorder   Hyperlipidemia   SUBJECTIVE  No chest pain or sob overnight.  Ambulating w/o difficulty.  CURRENT MEDS . aspirin  81 mg Oral Daily  . atorvastatin  80 mg Oral q1800  . metoprolol tartrate  12.5 mg Oral BID  . pantoprazole  40 mg Oral Daily  . Ticagrelor  90 mg Oral BID   OBJECTIVE  Filed Vitals:   06/20/13 2027 06/20/13 2100 06/21/13 0010 06/21/13 0430  BP: 94/47 116/94 95/72 130/89  Pulse: 93 96 89 94  Temp: 98.2 F (36.8 C)  99.6 F (37.6 C) 97.9 F (36.6 C)  TempSrc: Oral  Oral Oral  Resp: 18 18 17 17   Height:      Weight:   209 lb 14.1 oz (95.2 kg)   SpO2: 98% 98% 97% 98%    Intake/Output Summary (Last 24 hours) at 06/21/13 0805 Last data filed at 06/20/13 2300  Gross per 24 hour  Intake 1102.5 ml  Output   1000 ml  Net  102.5 ml   Filed Weights   06/20/13 0319 06/20/13 0753 06/21/13 0010  Weight: 210 lb (95.255 kg) 210 lb (95.255 kg) 209 lb 14.1 oz (95.2 kg)    PHYSICAL EXAM  General: Pleasant, NAD. Neuro: Alert and oriented X 3. Moves all extremities spontaneously. Psych: Normal affect. HEENT:  Normal  Neck: Supple without bruits or JVD. Lungs:  Resp regular and unlabored, CTA. Heart: RRR no s3, s4, or murmurs. Abdomen: Soft, non-tender, non-distended, BS + x 4.  Extremities: No clubbing, cyanosis or edema. DP/PT/Radials 2+ and equal bilaterally.  Accessory Clinical Findings  CBC  Recent Labs  06/20/13 0940 06/21/13 0354  WBC 6.1 6.9  HGB 15.7 15.2  HCT 46.2 46.2  MCV 89.7 89.9  PLT 240 236   Basic Metabolic Panel  Recent Labs  06/20/13 0325 06/20/13 0940 06/21/13 0354  NA 138  --  139  K 3.6*  --  4.1  CL 101  --  102  CO2 24  --  22  GLUCOSE 127*  --  109*  BUN 10  --  8  CREATININE 1.00 0.91 0.97  CALCIUM  9.3  --  8.7   Cardiac Enzymes  Recent Labs  06/20/13 0941  TROPONINI 0.48*   Hemoglobin A1C  Recent Labs  06/20/13 0940  HGBA1C 6.4*   Fasting Lipid Panel  Recent Labs  06/20/13 0940  CHOL 240*  HDL 30*  LDLCALC 168*  TRIG 210*  CHOLHDL 8.0   TELE  rsr  ECG  Rsr, 86, lat twi  Radiology/Studies  Dg Chest Port 1 View  06/20/2013   CLINICAL DATA:  Left-sided chest pain.  EXAM: PORTABLE CHEST - 1 VIEW  COMPARISON:  Chest x-ray 07/29/2010.  FINDINGS: Lung volumes are normal. No consolidative airspace disease. No pleural effusions. No pneumothorax. No pulmonary nodule or mass noted. Pulmonary vasculature and the cardiomediastinal silhouette are within normal limits.  IMPRESSION: 1.  No radiographic evidence of acute cardiopulmonary disease.   Electronically Signed   By: 09/28/2010 M.D.   On: 06/20/2013 03:46   ASSESSMENT AND PLAN  1.  NSTEMI/CAD: s/p PCI/DES x 2 to the RCA.  No chest pain overnight.  Ambulating w/o difficulty.  Cont asa, statin, bb, brilinta.  Cardiac to ambulate this AM.  OK to d/c if stable.  We will arrange for f/u within the next 14 days.  2.  HTN:  Stable.  BB added.  3.  HL:  LDL 168.  Cont high potency statin.  Signed, Nicolasa Ducking NP   Patient seen and examined. Agree with assessment and plan. No recurrent chest pain. Results of cath and PCI noted. Now well beta-blocked. Would increase lopressor to 25 mg bis and consider adding low dose ACE-I with lisinopril initially at 2.5 mg and titrate as outpatient. DC today.   Lennette Bihari, MD, [facility] 06/21/2013 9:03 AM

## 2013-06-21 NOTE — Progress Notes (Signed)
Utilization Review Completed Natoria Archibald J. Renzo Vincelette, RN, BSN, NCM [phone]  

## 2013-06-21 NOTE — Discharge Summary (Signed)
Discharge Summary   Patient ID: Jacob [name],  MRN: [id number], DOB/AGE: [birth year] 48 y.o.  Admit date: 06/20/2013 Discharge date: 06/21/2013  Primary Care Provider: [facility] Primary Cardiologist: Mendel Ryder, MD   Discharge Diagnoses Principal Problem:   NSTEMI (non-ST elevated myocardial infarction)  **Status-post PCI and DES of the RCA x 2 this admission.  Active Problems:   CAD (coronary artery disease)   Hypertension   Anxiety disorder   Hyperlipidemia  Allergies No Known Allergies  Procedures  Cardiac Catheterization and Percutaneous Coronary Intervention 3.26.2015  HEMODYNAMICS:  Aortic pressure 111/78 mmHg; LV pressure 133/6 mmHg; LVEDP 13 mm mercury  ANGIOGRAPHIC DATA:     The left main coronary artery is widely patent and relatively short. The left anterior descending artery is widely patent. The mid vessel contains diffuse 50% narrowing.. The left circumflex artery is large and widely patent. The right coronary artery is large and severely diseased with a mid vessel greater than 90% stenosis and distal in-stent restenosis of 70%, eccentric.Marland Kitchen  **The mid RCA was successfully stented using a 4.0 x 29mm Promus Premier DES;  The distal RCA was successfully stented using a 3.0 x 34mm Promus Premier DES.  EF: 55% _____________   History of Present Illness  48 y/o male with a h/o CAD s/p prior inferior MI's and stenting of the mid and distal RCA in 2010 and 2012.  He was in his usual state of health until 06/19/2013, when he developed chest pain associated with nausea and vomiting after eating dinner.  He presented to the [facility] ED on the morning of 3/26, where ECG was non-acute and initial troponin was normal.  He was admitted for further evaluation.  Hospital Course  Patient ruled in for NSTEMI with a peak troponin of 0.48.  He had no further chest pain.  Cardiology was consulted and it was felt that he would require diagnostic catheterization.  This was performed on  3/26, revealing severe in-stent restenosis in the mid and distal RCA with otherwise non-obstructive CAD and normal LV function.  Both areas were successfully treated with drug-eluting stents with restoration of good distal flow.  Patient tolerated procedure well and post-procedure has been ambulating without recurrent symptoms or limitations.  He has had no further chest pain.  We have added beta blocker, low-dose ace inhibitor, high potency statin, asa, and brilinta therapy to his regimen.  He will be discharged home today in good condition and we have arranged for early follow-up next week.  We will check a BMET @ that time given initiation of ACEI.  Discharge Vitals Blood pressure 136/97, pulse 86, temperature 97.8 F (36.6 C), temperature source Oral, resp. rate 18, height 5\' 8"  (1.727 m), weight 209 lb 14.1 oz (95.2 kg), SpO2 98.00%.  Filed Weights   06/20/13 0319 06/20/13 0753 06/21/13 0010  Weight: 210 lb (95.255 kg) 210 lb (95.255 kg) 209 lb 14.1 oz (95.2 kg)   Labs  CBC  Recent Labs  06/20/13 0940 06/21/13 0354  WBC 6.1 6.9  HGB 15.7 15.2  HCT 46.2 46.2  MCV 89.7 89.9  PLT 240 236   Basic Metabolic Panel  Recent Labs  06/20/13 0325 06/20/13 0940 06/21/13 0354  NA 138  --  139  K 3.6*  --  4.1  CL 101  --  102  CO2 24  --  22  GLUCOSE 127*  --  109*  BUN 10  --  8  CREATININE 1.00 0.91 0.97  CALCIUM 9.3  --  8.7   Cardiac Enzymes  Recent Labs  06/20/13 0941  TROPONINI 0.48*   Hemoglobin A1C  Recent Labs  06/20/13 0940  HGBA1C 6.4*   Fasting Lipid Panel  Recent Labs  06/20/13 0940  CHOL 240*  HDL 30*  LDLCALC 168*  TRIG 210*  CHOLHDL 8.0   Disposition  Pt is being discharged home today in good condition.  Follow-up Plans & Appointments      Follow-up Information   Follow up with Norma Fredrickson, NP On 07/01/2013. (11:30 AM)    Specialty:  Nurse Practitioner   Contact information:   [address] [place]  [address] [phone]      Discharge Medications    Medication List         aspirin 81 MG chewable tablet  Chew 1 tablet (81 mg total) by mouth daily.     atorvastatin 80 MG tablet  Commonly known as:  LIPITOR  Take 1 tablet (80 mg total) by mouth daily at 6 PM.     lisinopril 2.5 MG tablet  Commonly known as:  ZESTRIL  Take 1 tablet (2.5 mg total) by mouth daily.     metoprolol tartrate 25 MG tablet  Commonly known as:  LOPRESSOR  Take 1 tablet (25 mg total) by mouth 2 (two) times daily.     nitroGLYCERIN 0.4 MG SL tablet  Commonly known as:  NITROSTAT  Place 1 tablet (0.4 mg total) under the tongue every 5 (five) minutes as needed for chest pain.     Ticagrelor 90 MG Tabs tablet  Commonly known as:  BRILINTA  Take 1 tablet (90 mg total) by mouth 2 (two) times daily.       Outstanding Labs/Studies  F/u lipids/lft's in 8 wks. F/u BMET @ f/u visit.  Duration of Discharge Encounter   Greater than 30 minutes including physician time.  Signed, Nicolasa Ducking NP 06/21/2013, 11:33 AM

## 2013-06-21 NOTE — Care Management Note (Signed)
    Page 1 of 1   06/21/2013     10:31:34 AM   CARE MANAGEMENT NOTE 06/21/2013  Patient:  Jacob [name], Jacob [name]   Account Number:  [id number]  Date Initiated:  06/21/2013  Documentation initiated by:  [facility]  Subjective/Objective Assessment:   48 y.o. male with PMH of CAD s/p PCI x 2 (2010-12) presented with non exertional L sided intermittent chest pain radiating to his L shoulder.//Home with spouse     Action/Plan:   PROCEDURE: 1. Left heart catheterization; 2. Coronary angiography; 3. Left ventriculography; 4. PCI RCA with DES x2//Benefits check for Brilinta 90mg    Anticipated DC Date:  06/21/2013   Anticipated DC Plan:  HOME/SELF CARE      DC Planning Services  CM consult      PAC Choice  NA   Choice offered to / List presented to:             Status of service:  Completed, signed off Medicare Important Message given?   (If response is "NO", the following Medicare IM given date fields will be blank) Date Medicare IM given:   Date Additional Medicare IM given:    Discharge Disposition:    Per UR Regulation:    If discussed at Long Length of Stay Meetings, dates discussed:    Comments:  06/21/13 0845 06/23/13, RN, BSN, NCM [phone] Spoke with pt at bedside regarding benefits check for Brilinta.  Pt has brochure with 30 day free card and refill assistance card intact.  Pt utilizes [facility] on [phone] and the [employer] for prescription needs.  NCM called pharmacy to confirm availability of medication.  Information relayed to pt.  Pt verbalizes importance of filling medication upon discharge.

## 2013-06-21 NOTE — Progress Notes (Signed)
CARDIAC REHAB PHASE I   PRE:  Rate/Rhythm: 84 SR  BP:  Supine: 136/91  Sitting:   Standing:    SaO2:   MODE:  Ambulation: 1000 ft   POST:  Rate/Rhythm: 104 ST  BP:  Supine:   Sitting: 152/102  Standing:    SaO2:  0753-0900 Pt tolerated ambulation well without c/o of cp or SOB. BP up before and more after walk. Completed stent discharge education with pt. He voices understanding. Pt agrees to Outpt. CRP in [place], will send referral. Strongly encouraged medication compliance and the reasons for it.  Melina Copa RN 06/21/2013 8:57 AM

## 2013-06-21 NOTE — Telephone Encounter (Signed)
New message    7-14 days with  Dawayne Patricia on  4/6 @ 11:30 per [facility].

## 2013-06-21 NOTE — Discharge Instructions (Signed)
**  PLEASE REMEMBER TO BRING ALL OF YOUR MEDICATIONS TO EACH OF YOUR FOLLOW-UP OFFICE VISITS. ° °NO HEAVY LIFTING X 2 WEEKS. °NO SEXUAL ACTIVITY X 2 WEEKS. °NO DRIVING X 1 WEEK. °NO SOAKING BATHS, HOT TUBS, POOLS, ETC., X 7 DAYS. ° °Radial Site Care °Refer to this sheet in the next few weeks. These instructions provide you with information on caring for yourself after your procedure. Your caregiver may also give you more specific instructions. Your treatment has been planned according to current medical practices, but problems sometimes occur. Call your caregiver if you have any problems or questions after your procedure. °HOME CARE INSTRUCTIONS °· You may shower the day after the procedure. Remove the bandage (dressing) and gently wash the site with plain soap and water. Gently pat the site dry.  °· Do not apply powder or lotion to the site.  °· Do not submerge the affected site in water for 3 to 5 days.  °· Inspect the site at least twice daily.  °· Do not flex or bend the affected arm for 24 hours.  °· No lifting over 5 pounds (2.3 kg) for 5 days after your procedure.  ° °What to expect: °· Any bruising will usually fade within 1 to 2 weeks.  °· Blood that collects in the tissue (hematoma) may be painful to the touch. It should usually decrease in size and tenderness within 1 to 2 weeks.  °SEEK IMMEDIATE MEDICAL CARE IF: °· You have unusual pain at the radial site.  °· You have redness, warmth, swelling, or pain at the radial site.  °· You have drainage (other than a small amount of blood on the dressing).  °· You have chills.  °· You have a fever or persistent symptoms for more than 72 hours.  °· You have a fever and your symptoms suddenly get worse.  °· Your arm becomes pale, cool, tingly, or numb.  °· You have heavy bleeding from the site. Hold pressure on the site.  ° °

## 2013-07-01 ENCOUNTER — Encounter: Payer: Self-pay | Admitting: Nurse Practitioner

## 2013-07-01 ENCOUNTER — Ambulatory Visit (INDEPENDENT_AMBULATORY_CARE_PROVIDER_SITE_OTHER): Payer: BC Managed Care – PPO | Admitting: Nurse Practitioner

## 2013-07-01 VITALS — BP 150/120 | HR 104 | Ht 68.0 in | Wt 210.1 lb

## 2013-07-01 DIAGNOSIS — Z955 Presence of coronary angioplasty implant and graft: Secondary | ICD-10-CM

## 2013-07-01 DIAGNOSIS — Z9861 Coronary angioplasty status: Secondary | ICD-10-CM

## 2013-07-01 LAB — BASIC METABOLIC PANEL
BUN: 8 mg/dL (ref 6–23)
CO2: 27 mEq/L (ref 19–32)
Calcium: 9.3 mg/dL (ref 8.4–10.5)
Chloride: 100 mEq/L (ref 96–112)
Creatinine, Ser: 1.2 mg/dL (ref 0.4–1.5)
GFR: 87.59 mL/min (ref >60.00)
Glucose, Bld: 96 mg/dL (ref 70–99)
Potassium: 4.3 mEq/L (ref 3.5–5.1)
Sodium: 136 mEq/L (ref 135–145)

## 2013-07-01 LAB — CBC
HCT: 47 % (ref 39.0–52.0)
Hemoglobin: 15.6 g/dL (ref 13.0–17.0)
MCHC: 33.3 g/dL (ref 30.0–36.0)
MCV: 89.3 fl (ref 78.0–100.0)
Platelets: 286 10*3/uL (ref 150.0–400.0)
RBC: 5.26 Mil/uL (ref 4.22–5.81)
RDW: 14.6 % (ref 11.5–14.6)
WBC: 7.7 10*3/uL (ref 4.5–10.5)

## 2013-07-01 MED ORDER — LOSARTAN POTASSIUM 50 MG PO TABS: 50.0000 mg | ORAL_TABLET | Freq: Every day | ORAL | Status: DC

## 2013-07-01 NOTE — Patient Instructions (Signed)
Stop the lisinopril  Start Losartan 50 mg a day - this is in the place of the lisinopril  If you keep coughing - try nasocort/flonase over the counter  We will check lab today  We will get someone from medical records to talk with you  I have sent a note to cardiac rehab  I will see you in a month  Monitor your BP at home - get an Omron unit  Call the [facility] office at [phone] if you have any questions, problems or concerns.

## 2013-07-01 NOTE — Progress Notes (Signed)
Jacob [name] Date of Birth: November 22, 1965 Medical Record #[id number]  History of Present Illness: Mr. Jacob [name] is seen back today for a TOC/post hospital visit. Seen for Dr. Katrinka Blazing. He is a 48 year old male with CAD with past inferior MI and stenting of the mid and distal RCA in 2010 and 2012; HTN, HLD and anxiety.   Most recently presented with NSTEMI with DES of the RCA x 2 due to restenosis of the prior mid and distal RCA stents. On Brilinta. EF was 55%.   Comes in today. Here alone. Doing ok. To return to work next Monday. No more chest pain. Not short of breath. Not dizzy or lightheaded. Has not had his medicines today yet. Has been taking them regularly until today. Notes a cough - says he has been on ACE in the past and had to stop due to cough. His cough now seems more like allergies. Eyes watery. No problems with his cath site. Having issues with his FMLA papers.    Current Outpatient Prescriptions  Medication Sig Dispense Refill  . aspirin 81 MG chewable tablet Chew 1 tablet (81 mg total) by mouth daily.      Marland Kitchen atorvastatin (LIPITOR) 80 MG tablet Take 1 tablet (80 mg total) by mouth daily at 6 PM.  30 tablet  6  . lisinopril (ZESTRIL) 2.5 MG tablet Take 1 tablet (2.5 mg total) by mouth daily.  30 tablet  6  . metoprolol tartrate (LOPRESSOR) 25 MG tablet Take 1 tablet (25 mg total) by mouth 2 (two) times daily.  60 tablet  6  . nitroGLYCERIN (NITROSTAT) 0.4 MG SL tablet Place 1 tablet (0.4 mg total) under the tongue every 5 (five) minutes as needed for chest pain.  25 tablet  3  . Ticagrelor (BRILINTA) 90 MG TABS tablet Take 1 tablet (90 mg total) by mouth 2 (two) times daily.  60 tablet  6   No current facility-administered medications for this visit.    No Known Allergies  Past Medical History  Diagnosis Date  . Coronary artery disease     a. s/p Inferior MI 2010/2012 with RCA stenting;  b.05/2013 Cath/PCI: LM nl LAD 45m, LCX nl, RCA 88m ISR (4.0x20 Promus Premier DES), 70d ISR  (3.0x16 Promus Premier DES), EF 55%  . Hypertension   . Anxiety disorder   . Hyperlipidemia     Past Surgical History  Procedure Laterality Date  . Coronary angioplasty with stent placement  2012; 06/20/2013    "1 + 2" (06/19/2013)    History  Smoking status  . Former Smoker -- 0.75 packs/day for 20 years  . Types: Cigarettes  . Quit date: 06/27/2010  Smokeless tobacco  . Never Used    History  Alcohol Use  . 4.2 oz/week  . 7 Cans of beer per week    No family history on file.  Review of Systems: The review of systems is per the HPI.  All other systems were reviewed and are negative.  Physical Exam: BP 150/120  Pulse 104  Ht 5\' 8"  (1.727 m)  Wt 210 lb 1.9 oz (95.31 kg)  BMI 31.96 kg/m2  SpO2 95% BP is 130/90 by me.  Patient is very pleasant and in no acute distress. Skin is warm and dry. Color is normal.  HEENT is unremarkable. Normocephalic/atraumatic. PERRL. Sclera are nonicteric. Neck is supple. No masses. No JVD. Lungs are clear. Cardiac exam shows a regular rate and rhythm. Abdomen is soft. Extremities are without edema.  Gait and ROM are intact. No gross neurologic deficits noted.  LABORATORY DATA: PENDING  Lab Results  Component Value Date   WBC 6.9 06/21/2013   HGB 15.2 06/21/2013   HCT 46.2 06/21/2013   PLT 236 06/21/2013   GLUCOSE 109* 06/21/2013   CHOL 240* 06/20/2013   TRIG 210* 06/20/2013   HDL 30* 06/20/2013   LDLCALC 168* 06/20/2013   ALT 25 06/28/2010   AST 31 06/28/2010   NA 139 06/21/2013   K 4.1 06/21/2013   CL 102 06/21/2013   CREATININE 0.97 06/21/2013   BUN 8 06/21/2013   CO2 22 06/21/2013   INR 1.02 06/20/2013   HGBA1C 6.4* 06/20/2013   INDICATIONS: Acute coronary syndrome with prolonged chest pain occurring at rest and radiating into the left arm associated with elevated cardiac markers but no acute EKG changes. Prior history of the LAD and right coronary stents in the setting of acute coronary syndrome. Most recently 2011.  PROCEDURE: 1. Left heart  catheterization; 2. Coronary angiography; 3. Left ventriculography; 4. PCI RCA with DES x2  CONSENT:  The risks, benefits, and details of the procedure were explained in detail to the patient. Risks including death, stroke, heart attack, kidney injury, allergy, limb ischemia, bleeding and radiation injury were discussed. The patient verbalized understanding and wanted to proceed. Informed written consent was obtained.  PROCEDURE TECHNIQUE: After Xylocaine anesthesia a 5 French slender sheath was placed in the right radial artery with a single anterior needle wall stick. Coronary angiography was done using a 5 [place] JR 4 and JL 3.5 cm diagnostic catheters. Left ventriculography was done using the 5 [place] JR 4 catheter and hand injection.  Review of the images demonstrated high-grade obstruction in the distal right coronary proximal to the previously placed stent and also a new severe stenosis in the mid vessel. The LAD stent and circumflex were widely patent.  A total of 9500 units of heparin was administered (5000 units after gaining radial access and in other 4500 units after the decision to perform PCI was made. The ACT was documented to be390 seconds. Brilinta 180 mg was administered orally. An AL 0.75 cm 6 [place] guide catheter was used to obtain guiding shots. A standard 6 [place] JR 4 had only intermittent cannulation of the right coronary. After obtaining adequate guiding shots we proceeded with PCI. We use a Pro-water guidewire.  A 3.0 x 15 mm long balloon was used to predilate the distal and the mid right coronary stenosis. We then positioned and deployed a 3.0 x 16 mm Promus Premier. He was deployed at 15 atmospheres. 2 balloon inflations were performed. We then positioned and deployed a 4.0 x 20 mm long Promus Premier in the mid stenosis. The stent was deployed at 16 atmospheres. We used a 3.25 x 12 mm long [place] balloon to post dilate the distal stent to 16 atmospheres. We then used a 4.5 x 15 mm [place]  balloon to post dilate the proximal stent. The peak pressure was 15 atmospheres. 200 mcg of intracoronary nitroglycerin was administered. The final result was felt to be acceptable.  After the final angiographic images were obtained. The radial sheath was removed and a wrist band was applied to achieve hemostasis.  MEDICATIONS: 2 mg of IV Versed and 50 mcg of fentanyl. 3 mg of IV verapamil was given intra-arterially.  CONTRAST: Total of 180 cc.  COMPLICATIONS: None  HEMODYNAMICS: Aortic pressure 111/78 mmHg; LV pressure 133/6 mmHg; LVEDP 13 mm mercury  ANGIOGRAPHIC DATA: The  left main coronary artery is widely patent and relatively short.  The left anterior descending artery is widely patent. The mid vessel contains diffuse 50% narrowing..  The left circumflex artery is large and widely patent.  The right coronary artery is large and severely diseased with a mid vessel greater than 90% stenosis and distal in-stent restenosis of 70%, eccentric.Marland Kitchen  PCI RESULTS: The mid and distal right coronary were reduced from greater than 90% to 0% and eccentric 70% to 0% after stenting as identified above. TIMI grade 3 flow was noted post procedure. Final mid stent postdilatation diameter was 4.5 mm and distal stent diameter 3.25 mm.  LEFT VENTRICULOGRAM: Left ventricular angiogram was done in the 30 RAO projection and revealed inferior and anterior hypokinesis with EF 55%.  IMPRESSIONS: 1. Acute coronary syndrome with high-grade mid stenosis and moderate to severe stenosis distally of the right coronary.  2. Successful implantation of DES in the mid right coronary reducing the stenosis from 90% to 0% and in the distal right coronary reducing a 70% eccentric stenosis to 0%.  3. Widely patent LAD stent and distal right coronary stents that were placed previously  4. Circumflex with luminal irregularities  5. Low normal left ventricular systolic function  RECOMMENDATION: Aspirin and Brilinta for at least one year  (May use Plavix if there is a financial concern).  Eligible for discharge in a.m. if no clinical complications.    Assessment / Plan: 1. NSTEMI - s/p DES x 2 to the RCA due to restenosis - EF was normal - doing well clinically.  2. HTN - BP up here - no medicines yet today - will take when he gets home. Switch from ACE to ARB - sounds like he has had issues in the past  3. Cough - seems more like allergy but has not tolerated ACE in the past - will go ahead and switch to ARB. May use Flonase, Nasocort, Claritin, etc.  4. HLD - on statin  Recheck labs today. Try to get his FMLA papers done. He was given the ok to return to work after April 10th. See him back in a month.   Patient is agreeable to this plan and will call if any problems develop in the interim.   Rosalio Macadamia, RN, ANP-C [facility] [address] [place], [place]  [address] [phone]

## 2013-07-05 ENCOUNTER — Telehealth: Payer: Self-pay | Admitting: Nurse Practitioner

## 2013-07-05 NOTE — Telephone Encounter (Signed)
Follow Up  Patient returned call

## 2013-07-05 NOTE — Telephone Encounter (Signed)
S/w pt suppose to drop off FMLA papers 4/7 for Jacob [name] Fiscal to fill out s/w pt was in [place] stated would drop them off 4/8 I gave pt an envelope with my name on it, came into today, 4/10 to drop off papers pt s/w Dena from medical records stated pt never mentioned my name pt is on way back here to drop off paperwork

## 2013-07-08 ENCOUNTER — Telehealth: Payer: Self-pay | Admitting: Nurse Practitioner

## 2013-07-08 NOTE — Telephone Encounter (Signed)
LVM For pt [employer] paperwork Ready For pick up

## 2013-07-08 NOTE — Telephone Encounter (Signed)
Pt picked Up [employer]

## 2013-07-22 ENCOUNTER — Telehealth: Payer: Self-pay

## 2013-07-22 NOTE — Telephone Encounter (Signed)
lmom.form pt dropped off is completed.pt to call back to let us know if he will pick form up or should we mail it to him.

## 2013-07-25 ENCOUNTER — Encounter (HOSPITAL_COMMUNITY)
Admission: RE | Admit: 2013-07-25 | Discharge: 2013-07-25 | Disposition: A | Discharge status: Home / Self Care | Payer: BC Managed Care – PPO | Source: Ambulatory Visit | Attending: Interventional Cardiology | Admitting: Interventional Cardiology

## 2013-07-25 NOTE — Progress Notes (Signed)
Cardiac Rehab Medication Review by a Pharmacist  Does the patient  feel that his/her medications are working for him/her?  yes  Has the patient been experiencing any side effects to the medications prescribed?  Yes, He has dry cough that bothers him a lot, he said he was on lisinopril before, that gave him a similar side effect.  Does the patient measure his/her own blood pressure or blood glucose at home?  no   Does the patient have any problems obtaining medications due to transportation or finances?   no  Understanding of regimen: Fair Understanding of indications: good Potential of compliance: good    Riki Rusk 07/25/2013 9:01 AM

## 2013-07-29 ENCOUNTER — Encounter (HOSPITAL_COMMUNITY): Payer: BC Managed Care – PPO

## 2013-07-30 ENCOUNTER — Encounter: Payer: Self-pay | Admitting: Nurse Practitioner

## 2013-07-30 ENCOUNTER — Ambulatory Visit (INDEPENDENT_AMBULATORY_CARE_PROVIDER_SITE_OTHER): Payer: BC Managed Care – PPO | Admitting: Nurse Practitioner

## 2013-07-30 VITALS — BP 138/90 | HR 84 | Ht 68.0 in | Wt 213.0 lb

## 2013-07-30 DIAGNOSIS — Z955 Presence of coronary angioplasty implant and graft: Secondary | ICD-10-CM

## 2013-07-30 DIAGNOSIS — E785 Hyperlipidemia, unspecified: Secondary | ICD-10-CM

## 2013-07-30 DIAGNOSIS — Z9861 Coronary angioplasty status: Secondary | ICD-10-CM

## 2013-07-30 LAB — HEPATIC FUNCTION PANEL
ALT: 35 U/L (ref 0–53)
AST: 37 U/L (ref 0–37)
Albumin: 3.8 g/dL (ref 3.5–5.2)
Alkaline Phosphatase: 96 U/L (ref 39–117)
Bilirubin, Direct: 0.1 mg/dL (ref 0.0–0.3)
Total Bilirubin: 0.6 mg/dL (ref 0.2–1.2)
Total Protein: 7.5 g/dL (ref 6.0–8.3)

## 2013-07-30 LAB — BASIC METABOLIC PANEL
BUN: 10 mg/dL (ref 6–23)
CO2: 25 mEq/L (ref 19–32)
Calcium: 9 mg/dL (ref 8.4–10.5)
Chloride: 104 mEq/L (ref 96–112)
Creatinine, Ser: 1.1 mg/dL (ref 0.4–1.5)
GFR: 89.36 mL/min (ref >60.00)
Glucose, Bld: 113 mg/dL — ABNORMAL HIGH (ref 70–99)
Potassium: 3.8 mEq/L (ref 3.5–5.1)
Sodium: 136 mEq/L (ref 135–145)

## 2013-07-30 LAB — LIPID PANEL
Cholesterol: 190 mg/dL (ref 0–200)
HDL: 29.7 mg/dL — ABNORMAL LOW (ref >39.00)
LDL Cholesterol: 112 mg/dL — ABNORMAL HIGH (ref 0–99)
Total CHOL/HDL Ratio: 6
Triglycerides: 240 mg/dL — ABNORMAL HIGH (ref 0.0–149.0)
VLDL: 48 mg/dL — ABNORMAL HIGH (ref 0.0–40.0)

## 2013-07-30 NOTE — Progress Notes (Signed)
Jacob [name] Date of Birth: [birth year] Medical Record #[id number]  History of Present Illness: Jacob [name] is seen back today for a 1 month visit. Seen for Dr. Katrinka Blazing. He is a 48 year old male with CAD with past inferior MI and stenting of the mid and distal RCA in 2010 and 2012; HTN, HLD and anxiety.  Most recently presented with NSTEMI with DES of the RCA x 2 due to restenosis of the prior mid and distal RCA stents. On Brilinta. EF was 55%.   Seen a month ago - was doing well. Did switch to from ACE to ARB due to coughing. Seemed like this was more allergies.   Comes in today. Here alone. Doing ok. No chest pain. Not short of breath. Not dizzy or lightheaded. Starts rehab tomorrow. Back to work and doing ok. Works 2nd. Still with a cough - no real change with switching from ACE to ARB. He is on Brilinta but is not short of breath.    Current Outpatient Prescriptions  Medication Sig Dispense Refill  . aspirin 81 MG chewable tablet Chew 1 tablet (81 mg total) by mouth daily.      Marland Kitchen atorvastatin (LIPITOR) 80 MG tablet Take 1 tablet (80 mg total) by mouth daily at 6 PM.  30 tablet  6  . losartan (COZAAR) 50 MG tablet Take 1 tablet (50 mg total) by mouth daily.  90 tablet  3  . metoprolol tartrate (LOPRESSOR) 25 MG tablet Take 1 tablet (25 mg total) by mouth 2 (two) times daily.  60 tablet  6  . nitroGLYCERIN (NITROSTAT) 0.4 MG SL tablet Place 1 tablet (0.4 mg total) under the tongue every 5 (five) minutes as needed for chest pain.  25 tablet  3  . Ticagrelor (BRILINTA) 90 MG TABS tablet Take 1 tablet (90 mg total) by mouth 2 (two) times daily.  60 tablet  6   No current facility-administered medications for this visit.    No Known Allergies  Past Medical History  Diagnosis Date  . Coronary artery disease     a. s/p Inferior MI 2010/2012 with RCA stenting;  b.05/2013 Cath/PCI: LM nl LAD 64m, LCX nl, RCA 19m ISR (4.0x20 Promus Premier DES), 70d ISR (3.0x16 Promus Premier DES), EF 55%  .  Hypertension   . Anxiety disorder   . Hyperlipidemia     Past Surgical History  Procedure Laterality Date  . Coronary angioplasty with stent placement  2012; 06/20/2013    "1 + 2" (06/19/2013)    History  Smoking status  . Former Smoker -- 0.75 packs/day for 20 years  . Types: Cigarettes  . Quit date: 06/27/2010  Smokeless tobacco  . Never Used    History  Alcohol Use  . 4.2 oz/week  . 7 Cans of beer per week    History reviewed. No pertinent family history.  Review of Systems: The review of systems is per the HPI.  All other systems were reviewed and are negative.  Physical Exam: BP 138/90  Pulse 84  Ht 5\' 8"  (1.727 m)  Wt 213 lb (96.616 kg)  BMI 32.39 kg/m2 Patient is very pleasant and in no acute distress. Skin is warm and dry. Color is normal.  HEENT is unremarkable. Normocephalic/atraumatic. PERRL. Sclera are nonicteric. Neck is supple. No masses. No JVD. Lungs are clear. Cardiac exam shows a regular rate and rhythm. Abdomen is soft. Extremities are without edema. Gait and ROM are intact. No gross neurologic deficits noted.  LABORATORY  DATA: PENDING  Lab Results  Component Value Date   WBC 7.7 07/01/2013   HGB 15.6 07/01/2013   HCT 47.0 07/01/2013   PLT 286.0 07/01/2013   GLUCOSE 96 07/01/2013   CHOL 240* 06/20/2013   TRIG 210* 06/20/2013   HDL 30* 06/20/2013   LDLCALC 168* 06/20/2013   ALT 25 06/28/2010   AST 31 06/28/2010   NA 136 07/01/2013   K 4.3 07/01/2013   CL 100 07/01/2013   CREATININE 1.2 07/01/2013   BUN 8 07/01/2013   CO2 27 07/01/2013   INR 1.02 06/20/2013   HGBA1C 6.4* 06/20/2013    INDICATIONS: Acute coronary syndrome with prolonged chest pain occurring at rest and radiating into the left arm associated with elevated cardiac markers but no acute EKG changes. Prior history of the LAD and right coronary stents in the setting of acute coronary syndrome. Most recently 2011.  PROCEDURE: 1. Left heart catheterization; 2. Coronary angiography; 3. Left ventriculography; 4.  PCI RCA with DES x2  CONSENT:  The risks, benefits, and details of the procedure were explained in detail to the patient. Risks including death, stroke, heart attack, kidney injury, allergy, limb ischemia, bleeding and radiation injury were discussed. The patient verbalized understanding and wanted to proceed. Informed written consent was obtained.  PROCEDURE TECHNIQUE: After Xylocaine anesthesia a 5 French slender sheath was placed in the right radial artery with a single anterior needle wall stick. Coronary angiography was done using a 5 [place] JR 4 and JL 3.5 cm diagnostic catheters. Left ventriculography was done using the 5 [place] JR 4 catheter and hand injection.  Review of the images demonstrated high-grade obstruction in the distal right coronary proximal to the previously placed stent and also a new severe stenosis in the mid vessel. The LAD stent and circumflex were widely patent.  A total of 9500 units of heparin was administered (5000 units after gaining radial access and in other 4500 units after the decision to perform PCI was made. The ACT was documented to be390 seconds. Brilinta 180 mg was administered orally. An AL 0.75 cm 6 [place] guide catheter was used to obtain guiding shots. A standard 6 [place] JR 4 had only intermittent cannulation of the right coronary. After obtaining adequate guiding shots we proceeded with PCI. We use a Pro-water guidewire.  A 3.0 x 15 mm long balloon was used to predilate the distal and the mid right coronary stenosis. We then positioned and deployed a 3.0 x 16 mm Promus Premier. He was deployed at 15 atmospheres. 2 balloon inflations were performed. We then positioned and deployed a 4.0 x 20 mm long Promus Premier in the mid stenosis. The stent was deployed at 16 atmospheres. We used a 3.25 x 12 mm long [place] balloon to post dilate the distal stent to 16 atmospheres. We then used a 4.5 x 15 mm [place] balloon to post dilate the proximal stent. The peak pressure was 15  atmospheres. 200 mcg of intracoronary nitroglycerin was administered. The final result was felt to be acceptable.  After the final angiographic images were obtained. The radial sheath was removed and a wrist band was applied to achieve hemostasis.  MEDICATIONS: 2 mg of IV Versed and 50 mcg of fentanyl. 3 mg of IV verapamil was given intra-arterially.  CONTRAST: Total of 180 cc.  COMPLICATIONS: None  HEMODYNAMICS: Aortic pressure 111/78 mmHg; LV pressure 133/6 mmHg; LVEDP 13 mm mercury  ANGIOGRAPHIC DATA: The left main coronary artery is widely patent and relatively short.  The left anterior descending artery is widely patent. The mid vessel contains diffuse 50% narrowing..  The left circumflex artery is large and widely patent.  The right coronary artery is large and severely diseased with a mid vessel greater than 90% stenosis and distal in-stent restenosis of 70%, eccentric.Marland Kitchen  PCI RESULTS: The mid and distal right coronary were reduced from greater than 90% to 0% and eccentric 70% to 0% after stenting as identified above. TIMI grade 3 flow was noted post procedure. Final mid stent postdilatation diameter was 4.5 mm and distal stent diameter 3.25 mm.  LEFT VENTRICULOGRAM: Left ventricular angiogram was done in the 30 RAO projection and revealed inferior and anterior hypokinesis with EF 55%.  IMPRESSIONS: 1. Acute coronary syndrome with high-grade mid stenosis and moderate to severe stenosis distally of the right coronary.  2. Successful implantation of DES in the mid right coronary reducing the stenosis from 90% to 0% and in the distal right coronary reducing a 70% eccentric stenosis to 0%.  3. Widely patent LAD stent and distal right coronary stents that were placed previously  4. Circumflex with luminal irregularities  5. Low normal left ventricular systolic function  RECOMMENDATION: Aspirin and Brilinta for at least one year (May use Plavix if there is a financial concern).  Eligible for  discharge in a.m. if no clinical complications.    Assessment / Plan:   1. NSTEMI - s/p DES x 2 to the RCA due to restenosis - EF was normal - continues to do well clinically.   2. HTN - BP fair.   3. Cough - still seems more like allergy but had not tolerated ACE in the past - now on ARB - he is not having shortness of breath - I do not feel this is Brilinta.  4. HLD - on statin - checking labs today.   Check fasting labs today. Starts cardiac rehab tomorrow. See Dr. Katrinka Blazing back in 3 months.   Patient is agreeable to this plan and will call if any problems develop in the interim.   Rosalio Macadamia, RN, ANP-C  [facility]  [address]  [place], [place] [address]  [phone]

## 2013-07-30 NOTE — Patient Instructions (Addendum)
  I think you are doing ok   Stay on your current medicines  We will check labs today  See Dr. Katrinka Blazing in 3 months  Call the [facility] office at [phone] if you have any questions, problems or concerns.

## 2013-07-31 ENCOUNTER — Encounter (HOSPITAL_COMMUNITY)
Admission: RE | Admit: 2013-07-31 | Discharge: 2013-07-31 | Disposition: A | Discharge status: Home / Self Care | Payer: BC Managed Care – PPO | Source: Ambulatory Visit | Attending: Interventional Cardiology | Admitting: Interventional Cardiology

## 2013-07-31 DIAGNOSIS — Z5189 Encounter for other specified aftercare: Secondary | ICD-10-CM | POA: Insufficient documentation

## 2013-07-31 DIAGNOSIS — E785 Hyperlipidemia, unspecified: Secondary | ICD-10-CM | POA: Insufficient documentation

## 2013-07-31 DIAGNOSIS — I209 Angina pectoris, unspecified: Secondary | ICD-10-CM | POA: Insufficient documentation

## 2013-07-31 DIAGNOSIS — I2510 Atherosclerotic heart disease of native coronary artery without angina pectoris: Secondary | ICD-10-CM | POA: Insufficient documentation

## 2013-07-31 DIAGNOSIS — I252 Old myocardial infarction: Secondary | ICD-10-CM | POA: Insufficient documentation

## 2013-07-31 DIAGNOSIS — I10 Essential (primary) hypertension: Secondary | ICD-10-CM | POA: Insufficient documentation

## 2013-07-31 NOTE — Progress Notes (Signed)
Pt started cardiac rehab today.  Pt tolerated light exercise without difficulty. Telemetry rhythm Sinus. Vital signs stable. Will continue to monitor the patient throughout  the program. Asah's short term goal is to lose weight. Oswin's long term goal is to start back to lifting and to lower his cholesterol.

## 2013-08-02 ENCOUNTER — Encounter (HOSPITAL_COMMUNITY): Payer: BC Managed Care – PPO

## 2013-08-05 ENCOUNTER — Other Ambulatory Visit: Payer: Self-pay

## 2013-08-05 ENCOUNTER — Telehealth: Payer: Self-pay | Admitting: Interventional Cardiology

## 2013-08-05 ENCOUNTER — Encounter (HOSPITAL_COMMUNITY)
Admission: RE | Admit: 2013-08-05 | Discharge: 2013-08-05 | Disposition: A | Discharge status: Home / Self Care | Payer: BC Managed Care – PPO | Source: Ambulatory Visit | Attending: Interventional Cardiology | Admitting: Interventional Cardiology

## 2013-08-05 MED ORDER — METOPROLOL TARTRATE 25 MG PO TABS: 25.0000 mg | ORAL_TABLET | Freq: Two times a day (BID) | ORAL | Status: DC

## 2013-08-05 MED ORDER — TICAGRELOR 90 MG PO TABS: 90.0000 mg | ORAL_TABLET | Freq: Two times a day (BID) | ORAL | Status: DC

## 2013-08-05 NOTE — Telephone Encounter (Signed)
New Message:  Pt is asking for clarification on his metoprolol.Jacob KitchenMarland [name]

## 2013-08-05 NOTE — Telephone Encounter (Signed)
Follow up  ° ° ° °Returning call back to nurse  °

## 2013-08-05 NOTE — Telephone Encounter (Signed)
Verified Metoprolol dosage for pt. Pt rqst an rx be sent to [facility] Done.pt verbalized understanding

## 2013-08-05 NOTE — Telephone Encounter (Signed)
called pt.lmom for pt to call back 

## 2013-08-07 ENCOUNTER — Encounter (HOSPITAL_COMMUNITY)
Admission: RE | Admit: 2013-08-07 | Discharge: 2013-08-07 | Disposition: A | Discharge status: Home / Self Care | Payer: BC Managed Care – PPO | Source: Ambulatory Visit | Attending: Interventional Cardiology | Admitting: Interventional Cardiology

## 2013-08-09 ENCOUNTER — Encounter (HOSPITAL_COMMUNITY): Payer: BC Managed Care – PPO

## 2013-08-12 ENCOUNTER — Encounter (HOSPITAL_COMMUNITY): Payer: BC Managed Care – PPO

## 2013-08-14 ENCOUNTER — Encounter (HOSPITAL_COMMUNITY): Payer: BC Managed Care – PPO

## 2013-08-16 ENCOUNTER — Encounter (HOSPITAL_COMMUNITY)
Admission: RE | Admit: 2013-08-16 | Discharge: 2013-08-16 | Disposition: A | Discharge status: Home / Self Care | Payer: BC Managed Care – PPO | Source: Ambulatory Visit | Attending: Interventional Cardiology | Admitting: Interventional Cardiology

## 2013-08-16 NOTE — Progress Notes (Signed)
Reviewed home exercise with pt today.  Pt plans to walk and ride bike at home for exercise.  Reviewed THR, pulse, RPE, sign and symptoms, NTG use, and when to call 911 or MD.  Pt voiced understanding. Annia Gomm, MA, ACSM RCEP  

## 2013-08-19 ENCOUNTER — Encounter (HOSPITAL_COMMUNITY): Payer: BC Managed Care – PPO

## 2013-08-21 ENCOUNTER — Encounter (HOSPITAL_COMMUNITY): Payer: BC Managed Care – PPO

## 2013-08-23 ENCOUNTER — Encounter (HOSPITAL_COMMUNITY): Payer: BC Managed Care – PPO

## 2013-08-26 ENCOUNTER — Telehealth: Payer: Self-pay | Admitting: Interventional Cardiology

## 2013-08-26 ENCOUNTER — Encounter (HOSPITAL_COMMUNITY)
Admission: RE | Admit: 2013-08-26 | Discharge: 2013-08-26 | Disposition: A | Discharge status: Home / Self Care | Payer: BC Managed Care – PPO | Source: Ambulatory Visit | Attending: Interventional Cardiology | Admitting: Interventional Cardiology

## 2013-08-26 DIAGNOSIS — I2510 Atherosclerotic heart disease of native coronary artery without angina pectoris: Secondary | ICD-10-CM | POA: Insufficient documentation

## 2013-08-26 DIAGNOSIS — Z5189 Encounter for other specified aftercare: Secondary | ICD-10-CM | POA: Insufficient documentation

## 2013-08-26 DIAGNOSIS — I252 Old myocardial infarction: Secondary | ICD-10-CM | POA: Insufficient documentation

## 2013-08-26 DIAGNOSIS — I209 Angina pectoris, unspecified: Secondary | ICD-10-CM | POA: Insufficient documentation

## 2013-08-26 DIAGNOSIS — I10 Essential (primary) hypertension: Secondary | ICD-10-CM | POA: Insufficient documentation

## 2013-08-26 DIAGNOSIS — E785 Hyperlipidemia, unspecified: Secondary | ICD-10-CM | POA: Insufficient documentation

## 2013-08-26 NOTE — Progress Notes (Signed)
Jacob [name] 48 y.o. male Nutrition Note Spoke with pt.  Nutrition Plan and Nutrition Survey goals reviewed with pt. Pt is not currently following the Therapeutic Lifestyle Changes diet. Pt reports he has made "some changes" to his diet but "not really." Per discussion, barriers to dietary changes include pt has "young children" and works second shift. Pt states he has 8 children "ages 5,7,74 up to 28 years old." Ways to overcome barriers discussed. Pt is going to try to choose leaner cuts of red meats (e.g. ground beef, [place] sausage). Pt consumes "a snack size Oreo McFlurry about 2 times a week," which pt is going to try to choose a low fat frozen dessert alternative. Pt chooses salty snacks frequently (e.g. Regular or baked chips, snack mixes, and Ritz/[employer] crackers). Healthier alternatives discussed. Pt wants to lose wt. Wt loss tips reviewed.  Pt is pre-diabetic. Pt expressed understanding of the information reviewed. Pt aware of nutrition education classes offered and is unable to attend nutrition classes.  Nutrition Diagnosis   Food-and nutrition-related knowledge deficit related to lack of exposure to information as related to diagnosis of: ? CVD ?  Pre-DM (A1c 6.4)    Obesity related to excessive energy intake as evidenced by a BMI of 31.5  Nutrition RX/ Estimated Daily Nutrition Needs for: wt loss  1750-2250 Kcal, 45-60 gm fat, 11-15 gm sat fat, 1.7-2.2 gm trans-fat, <1500 mg sodium  Nutrition Intervention   Pt's individual nutrition plan reviewed with pt.   Benefits of adopting Therapeutic Lifestyle Changes discussed when Medficts reviewed.   Pt to attend the Portion Distortion class   Pt given handouts for: ? Nutrition I class ? Nutrition II class   Continue client-centered nutrition education by RD, as part of interdisciplinary care. Goal(s)   Pt to identify and limit food sources of saturated fat, trans fat, and cholesterol   Pt to identify food quantities necessary to  achieve: ? wt loss to a goal wt of 187-205 lb (85.2-93.4 kg) at graduation from cardiac rehab.   Monitor and Evaluate progress toward nutrition goal with team.  Mickle Plumb, M.Ed, RD, LDN, CDE 08/26/2013 11:11 AM Nutrition Risk: Change to Moderate

## 2013-08-26 NOTE — Telephone Encounter (Signed)
returned pt call. lmtcb 

## 2013-08-26 NOTE — Telephone Encounter (Signed)
New problem   Pt need to speak to nurse concerning starting MyChart.

## 2013-08-27 ENCOUNTER — Telehealth: Payer: Self-pay | Admitting: Interventional Cardiology

## 2013-08-27 NOTE — Telephone Encounter (Signed)
Patient is returning your call. Please call back.  °

## 2013-08-27 NOTE — Telephone Encounter (Signed)
my chart letter  left at front desk for pick up by pt.pt aware.

## 2013-08-27 NOTE — Telephone Encounter (Signed)
returned pt call.3rd attempt lmtcb

## 2013-08-27 NOTE — Telephone Encounter (Signed)
2nd attempt.lmom for pt to call back if still needs assistance signing up for my chart.

## 2013-08-27 NOTE — Telephone Encounter (Signed)
Follow up     Returning call back to [place].

## 2013-08-28 ENCOUNTER — Encounter (HOSPITAL_COMMUNITY): Payer: BC Managed Care – PPO

## 2013-08-29 ENCOUNTER — Encounter: Payer: Self-pay | Admitting: Interventional Cardiology

## 2013-08-30 ENCOUNTER — Encounter (HOSPITAL_COMMUNITY): Payer: BC Managed Care – PPO

## 2013-09-02 ENCOUNTER — Telehealth: Payer: Self-pay | Admitting: Interventional Cardiology

## 2013-09-02 ENCOUNTER — Encounter (HOSPITAL_COMMUNITY): Payer: BC Managed Care – PPO

## 2013-09-02 NOTE — Telephone Encounter (Signed)
New message    Patient calling letting the nurse know he will be coming by the office to drop off paper work.

## 2013-09-04 ENCOUNTER — Encounter (HOSPITAL_COMMUNITY): Payer: BC Managed Care – PPO

## 2013-09-06 ENCOUNTER — Encounter (HOSPITAL_COMMUNITY): Payer: BC Managed Care – PPO

## 2013-09-09 ENCOUNTER — Encounter (HOSPITAL_COMMUNITY): Payer: BC Managed Care – PPO

## 2013-09-10 ENCOUNTER — Telehealth: Payer: Self-pay | Admitting: Interventional Cardiology

## 2013-09-10 NOTE — Telephone Encounter (Signed)
Walk In pt Form " Physicians Statement" Misty Stanley Back Wednesday  Will give to Her (6.17)   6.16.15/km

## 2013-09-11 ENCOUNTER — Encounter (HOSPITAL_COMMUNITY): Payer: BC Managed Care – PPO

## 2013-09-11 ENCOUNTER — Telehealth: Payer: Self-pay | Admitting: *Deleted

## 2013-09-11 NOTE — Telephone Encounter (Signed)
S/w pt whom dropped off paperwork today and stated needed to be filled out ASAP.  The paperwork if for [facility], [employer].  I sent the paperwork to medical records and Selena Batten is going to send it to [employer] to be evaluated.

## 2013-09-11 NOTE — Telephone Encounter (Signed)
Showed Julieta Gutting, RN, paperwork stated it should go to [employer]. Brought papers back to [facility] in medical records and stated would send to [employer].

## 2013-09-11 NOTE — Telephone Encounter (Signed)
lmovm received paperwork from medical records today do not know what it is and wanted Norma Fredrickson to fill out ASAP. Not sure Lawson Fiscal can waiting for pt to call me back.

## 2013-09-13 ENCOUNTER — Encounter (HOSPITAL_COMMUNITY): Payer: BC Managed Care – PPO

## 2013-09-13 ENCOUNTER — Telehealth (HOSPITAL_COMMUNITY): Payer: Self-pay | Admitting: *Deleted

## 2013-09-16 ENCOUNTER — Encounter (HOSPITAL_COMMUNITY): Payer: BC Managed Care – PPO

## 2013-09-18 ENCOUNTER — Encounter (HOSPITAL_COMMUNITY): Payer: BC Managed Care – PPO

## 2013-09-20 ENCOUNTER — Encounter (HOSPITAL_COMMUNITY): Payer: BC Managed Care – PPO

## 2013-09-23 ENCOUNTER — Encounter (HOSPITAL_COMMUNITY): Payer: BC Managed Care – PPO

## 2013-09-25 ENCOUNTER — Encounter (HOSPITAL_COMMUNITY): Payer: BC Managed Care – PPO

## 2013-09-30 ENCOUNTER — Encounter (HOSPITAL_COMMUNITY): Payer: BC Managed Care – PPO

## 2013-10-02 ENCOUNTER — Encounter (HOSPITAL_COMMUNITY): Payer: BC Managed Care – PPO

## 2013-10-04 ENCOUNTER — Encounter (HOSPITAL_COMMUNITY): Payer: BC Managed Care – PPO

## 2013-10-07 ENCOUNTER — Encounter (HOSPITAL_COMMUNITY): Payer: BC Managed Care – PPO

## 2013-10-09 ENCOUNTER — Encounter (HOSPITAL_COMMUNITY): Payer: BC Managed Care – PPO

## 2013-10-11 ENCOUNTER — Encounter (HOSPITAL_COMMUNITY): Payer: BC Managed Care – PPO

## 2013-10-11 ENCOUNTER — Encounter: Payer: Self-pay | Admitting: Interventional Cardiology

## 2013-10-14 ENCOUNTER — Encounter (HOSPITAL_COMMUNITY): Payer: BC Managed Care – PPO

## 2013-10-16 ENCOUNTER — Encounter (HOSPITAL_COMMUNITY): Payer: BC Managed Care – PPO

## 2013-10-18 ENCOUNTER — Encounter (HOSPITAL_COMMUNITY): Payer: BC Managed Care – PPO

## 2013-10-21 ENCOUNTER — Encounter (HOSPITAL_COMMUNITY): Payer: BC Managed Care – PPO

## 2013-10-23 ENCOUNTER — Encounter (HOSPITAL_COMMUNITY): Payer: BC Managed Care – PPO

## 2013-10-25 ENCOUNTER — Encounter (HOSPITAL_COMMUNITY): Payer: BC Managed Care – PPO

## 2013-10-28 ENCOUNTER — Encounter (HOSPITAL_COMMUNITY): Payer: BC Managed Care – PPO

## 2013-10-30 ENCOUNTER — Encounter (HOSPITAL_COMMUNITY): Payer: BC Managed Care – PPO

## 2013-11-01 ENCOUNTER — Encounter (HOSPITAL_COMMUNITY): Payer: BC Managed Care – PPO

## 2013-11-04 ENCOUNTER — Encounter: Payer: Self-pay | Admitting: Interventional Cardiology

## 2013-11-04 ENCOUNTER — Ambulatory Visit (INDEPENDENT_AMBULATORY_CARE_PROVIDER_SITE_OTHER): Payer: BC Managed Care – PPO | Admitting: Interventional Cardiology

## 2013-11-04 ENCOUNTER — Encounter (HOSPITAL_COMMUNITY): Payer: BC Managed Care – PPO

## 2013-11-04 VITALS — BP 144/90 | HR 85 | Ht 68.0 in | Wt 215.4 lb

## 2013-11-04 DIAGNOSIS — I10 Essential (primary) hypertension: Secondary | ICD-10-CM

## 2013-11-04 DIAGNOSIS — I2510 Atherosclerotic heart disease of native coronary artery without angina pectoris: Secondary | ICD-10-CM

## 2013-11-04 DIAGNOSIS — Z9861 Coronary angioplasty status: Secondary | ICD-10-CM

## 2013-11-04 DIAGNOSIS — E785 Hyperlipidemia, unspecified: Secondary | ICD-10-CM

## 2013-11-04 DIAGNOSIS — Z955 Presence of coronary angioplasty implant and graft: Secondary | ICD-10-CM

## 2013-11-04 NOTE — Progress Notes (Signed)
Patient ID: Jacob [name], male   DOB: [birth year], 48 y.o.   MRN: [id number]    [address] [place], [place]  [address] Phone: [phone] Fax:  [phone]  Date:  11/04/2013   ID:  Jacob [name], DOB [birth year], MRN [id number]  PCP:  Lesleigh Noe, MD   ASSESSMENT:  1. 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 Currently asymptomatic and comply with medical regimen. DES x2 to RCA in March 2015 2. Hyperlipidemia, currently on therapy and compliant 3. Hypertension 4. Anxiety disorder  PLAN:  1. Increase in aerobic activity 2. Call if chest pain 3. Continue lifelong dual antiplatelet therapy   SUBJECTIVE: Jacob [name] is a 48 y.o. male returns today and is by himself. He is back at work. He completed cardiac rehabilitation. He had a case compliance with medical regimen. He has not had syncope, palpitations, angina, dyspnea, or other medical complaints.   Wt Readings from Last 3 Encounters:  11/04/13 215 lb 6.4 oz (97.705 kg)  07/30/13 213 lb (96.616 kg)  07/25/13 211 lb 13.8 oz (96.1 kg)     Past Medical History  Diagnosis Date  . Coronary artery disease     a. s/p Inferior MI 2010/2012 with RCA stenting;  b.05/2013 Cath/PCI: LM nl LAD 43m, LCX nl, RCA 24m ISR (4.0x20 Promus Premier DES), 70d ISR (3.0x16 Promus Premier DES), EF 55%  . Hypertension   . Anxiety disorder   . Hyperlipidemia     Current Outpatient Prescriptions  Medication Sig Dispense Refill  . aspirin 81 MG tablet Take 81 mg by mouth daily.      Marland Kitchen atorvastatin (LIPITOR) 80 MG tablet Take 1 tablet (80 mg total) by mouth daily at 6 PM.  30 tablet  6  . losartan (COZAAR) 50 MG tablet Take 1 tablet (50 mg total) by mouth daily.  90 tablet  3  . metoprolol tartrate (LOPRESSOR) 25 MG tablet Take 1 tablet (25 mg total) by mouth 2 (two) times daily.  60 tablet  6  . nitroGLYCERIN (NITROSTAT) 0.4 MG SL tablet Place 1 tablet (0.4 mg  total) under the tongue every 5 (five) minutes as needed for chest pain.  25 tablet  3  . ticagrelor (BRILINTA) 90 MG TABS tablet Take 1 tablet (90 mg total) by mouth 2 (two) times daily.  60 tablet  6   No current facility-administered medications for this visit.    Allergies:   No Known Allergies  Social History:  The patient  reports that he quit smoking about 3 years ago. His smoking use included Cigarettes. He has a 15 pack-year smoking history. He has never used smokeless tobacco. He reports that he drinks about 4.2 ounces of alcohol per week. He reports that he does not use illicit drugs.   ROS:  Please see the history of present illness.   Still gets anxious about his health. Has difficulty sleeping. Never evaluated for sleep apnea   All other systems reviewed and negative.   OBJECTIVE: VS:  BP 144/90  Pulse 85  Ht 5\' 8"  (1.727 m)  Wt 215 lb 6.4 oz (97.705 kg)  BMI 32.76 kg/m2 Well nourished, well developed, in no acute distress, young-appearing HEENT: normal Neck: JVD flat. Carotid bruit absent  Cardiac:  normal S1, S2; RRR; no murmur Lungs:  clear to auscultation bilaterally, no wheezing, rhonchi or rales Abd: soft, nontender, no hepatomegaly Ext: Edema absent. Pulses 2+ Skin: warm  and dry Neuro:  CNs 2-12 intact, no focal abnormalities noted  EKG:  Not performed       Signed, Darci Needle III, MD 11/04/2013 11:13 AM

## 2013-11-04 NOTE — Patient Instructions (Signed)
Your physician recommends that you continue on your current medications as directed. Please refer to the Current Medication list given to you today.  Your physician discussed the importance of regular exercise and recommended that you start or continue a regular exercise program for good health.  Your physician recommends that you follow a low sodium and low fat diet  Your physician wants you to follow-up in: 6 months with Dr.Smith You will receive a reminder letter in the mail two months in advance. If you don't receive a letter, please call our office to schedule the follow-up appointment.

## 2013-11-06 ENCOUNTER — Encounter (HOSPITAL_COMMUNITY): Payer: BC Managed Care – PPO

## 2013-11-08 ENCOUNTER — Encounter (HOSPITAL_COMMUNITY): Payer: BC Managed Care – PPO

## 2014-02-26 ENCOUNTER — Encounter: Payer: Self-pay | Admitting: Interventional Cardiology

## 2014-03-06 ENCOUNTER — Encounter (HOSPITAL_COMMUNITY): Payer: Self-pay | Admitting: Interventional Cardiology

## 2014-03-17 ENCOUNTER — Other Ambulatory Visit: Payer: Self-pay | Admitting: *Deleted

## 2014-03-17 MED ORDER — METOPROLOL TARTRATE 25 MG PO TABS: 25.0000 mg | ORAL_TABLET | Freq: Two times a day (BID) | ORAL | Status: DC

## 2014-03-17 MED ORDER — LOSARTAN POTASSIUM 50 MG PO TABS: 50.0000 mg | ORAL_TABLET | Freq: Every day | ORAL | Status: DC

## 2014-04-29 ENCOUNTER — Telehealth: Payer: Self-pay | Admitting: Interventional Cardiology

## 2014-04-29 DIAGNOSIS — I2510 Atherosclerotic heart disease of native coronary artery without angina pectoris: Secondary | ICD-10-CM

## 2014-04-29 NOTE — Telephone Encounter (Signed)
New MEssage  Pt asking about generic blood thiner medication. Please call back and discuss. Pt stated a vm can be left on Mobile #.

## 2014-05-07 NOTE — Telephone Encounter (Signed)
Pt called because he is running out of Brillinta 90 mg. Pt has only 2 pills left pt states he can't afford the $200 dollars co pay every month. We do not have 90 mg Brillinta now only 60 mg.  Pt would like to know if it is another Genetic medication that he can take that he can afford. Pt said that he called 04/29/14 and no one called back. Pt was seen last on 11/04/13 and a return visit in 6 months, which is due this month.

## 2014-05-07 NOTE — Telephone Encounter (Signed)
F/U        Pt calling, is almost out of Brilinta. No samples are available here in the office.    Pt would like to know what his next steps should be   Please return pt call, Pt begins work at [employer], if not avail. Please leave vm.

## 2014-05-07 NOTE — Telephone Encounter (Signed)
Clopidogrel 75 mg daily.

## 2014-05-08 MED ORDER — CLOPIDOGREL BISULFATE 75 MG PO TABS: 75.0000 mg | ORAL_TABLET | Freq: Every day | ORAL | Status: DC

## 2014-05-08 NOTE — Telephone Encounter (Signed)
Pt notified.  I have sent message to schedulers to arrange appt with Dr Katrinka Blazing end of March or first of April, pt thinks he should have insurance by then.

## 2014-07-10 ENCOUNTER — Ambulatory Visit: Payer: Self-pay | Admitting: Interventional Cardiology

## 2014-07-16 ENCOUNTER — Encounter: Payer: Self-pay | Admitting: Interventional Cardiology

## 2015-03-29 HISTORY — PX: JOINT REPLACEMENT: SHX530

## 2015-07-23 ENCOUNTER — Institutional Professional Consult (permissible substitution): Payer: Self-pay | Admitting: Pulmonary Disease

## 2020-12-31 ENCOUNTER — Encounter (HOSPITAL_COMMUNITY): Payer: Self-pay

## 2020-12-31 NOTE — Patient Instructions (Addendum)
DUE TO COVID-19 ONLY ONE VISITOR IS ALLOWED TO COME WITH YOU AND STAY IN THE WAITING ROOM ONLY DURING PRE OP AND PROCEDURE DAY OF SURGERY IF YOU ARE GOING HOME AFTER SURGERY. IF YOU ARE SPENDING THE NIGHT 2 PEOPLE MAY VISIT WITH YOU IN YOUR PRIVATE ROOM AFTER SURGERY UNTIL VISITING  HOURS ARE OVER AT 8:00 PM AND 1 VISITOR CAN SPEND THE NIGHT.   YOU NEED TO HAVE A COVID 19 TEST ON__10/14____THIS TEST MUST BE DONE BEFORE SURGERY,  COVID TESTING SITE  IS LOCATED AT [address], [place]. REMAIN IN YOUR CAR THIS IS A DRIVE UP TEST. AFTER YOUR COVID TEST PLEASE WEAR A MASK OUT IN PUBLIC AND SOCIAL DISTANCE AND WASH YOUR HANDS FREQUENTLY, ALSO ASK ALL YOUR CLOSE CONTACT PERSONS TO WEAR A MASK AND SOCIAL DISTANCE AND WASH THEIR HANDS FREQUENTLY ALSO.               Jacob [name]     Your procedure is scheduled on: 01/12/21   Report to [facility] Main  Entrance   Report to short  stay at 5:15 AM     Call this number if you have problems the morning of surgery [phone]    No food after midnight.    You may have clear liquid until 4:30 AM.    At 4:00 AM drink pre surgery drink.   Nothing by mouth after 4:30 AM.   CLEAR LIQUID DIET   Foods Allowed                                                                     Foods Excluded                                                                                               liquids that you cannot  Plain Jell-O any favor except red or purple                                           see through such as: Fruit ices (not with fruit pulp)                                     milk, soups, orange juice  Iced Popsicles                                    All solid food Carbonated beverages, regular and diet  Cranberry, grape and apple juices Sports drinks like Gatorade Lightly seasoned clear broth or consume(fat free) Sugar    BRUSH YOUR TEETH MORNING OF SURGERY AND RINSE YOUR MOUTH OUT,  NO CHEWING GUM CANDY OR MINTS.     Take these medicines the morning of surgery with A SIP OF WATER: none. Bring mask and tubing to the hospital                                You may not have any metal on your body including              piercings  Do not wear jewelry,  lotions, powders or  deodorant.              Men may shave face and neck.   Do not bring valuables to the hospital. [name] IS NOT             RESPONSIBLE   FOR VALUABLES.  Contacts, dentures or bridgework may not be worn into surgery.  Leave suitcase in the car. After surgery it may be brought to your room.               Please read over the following fact sheets you were given: _____________________________________________________________________             [facility] - Preparing for Surgery Before surgery, you can play an important role.  Because skin is not sterile, your skin needs to be as free of germs as possible.  You can reduce the number of germs on your skin by washing with CHG (chlorahexidine gluconate) soap before surgery.  CHG is an antiseptic cleaner which kills germs and bonds with the skin to continue killing germs even after washing. Please DO NOT use if you have an allergy to CHG or antibacterial soaps.  If your skin becomes reddened/irritated stop using the CHG and inform your nurse when you arrive at Short Stay.   You may shave your face/neck. Please follow these instructions carefully:  1.  Shower with CHG Soap the night before surgery and the  morning of Surgery.  2.  If you choose to wash your hair, wash your hair first as usual with your  normal  shampoo.  3.  After you shampoo, rinse your hair and body thoroughly to remove the  shampoo.                            4.  Use CHG as you would any other liquid soap.  You can apply chg directly  to the skin and wash                       Gently with a scrungie or clean washcloth.  5.  Apply the CHG Soap to your body ONLY FROM THE NECK DOWN.   Do  not use on face/ open                           Wound or open sores. Avoid contact with eyes, ears mouth and genitals (private parts).                       Wash face,  Genitals (private parts) with your normal soap.  6.  Wash thoroughly, paying special attention to the area where your surgery  will be performed.  7.  Thoroughly rinse your body with warm water from the neck down.  8.  DO NOT shower/wash with your normal soap after using and rinsing off  the CHG Soap.                9.  Pat yourself dry with a clean towel.            10.  Wear clean pajamas.            11.  Place clean sheets on your bed the night of your first shower and do not  sleep with pets. Day of Surgery : Do not apply any lotions/deodorants the morning of surgery.  Please wear clean clothes to the hospital/surgery center.  FAILURE TO FOLLOW THESE INSTRUCTIONS MAY RESULT IN THE CANCELLATION OF YOUR SURGERY PATIENT SIGNATURE_________________________________  NURSE SIGNATURE__________________________________  ________________________________________________________________________   Jacob [name]  An incentive spirometer is a tool that can help keep your lungs clear and active. This tool measures how well you are filling your lungs with each breath. Taking long deep breaths may help reverse or decrease the chance of developing breathing (pulmonary) problems (especially infection) following: A long period of time when you are unable to move or be active. BEFORE THE PROCEDURE  If the spirometer includes an indicator to show your best effort, your nurse or respiratory therapist will set it to a desired goal. If possible, sit up straight or lean slightly forward. Try not to slouch. Hold the incentive spirometer in an upright position. INSTRUCTIONS FOR USE  Sit on the edge of your bed if possible, or sit up as far as you can in bed or on a chair. Hold the incentive spirometer in an upright  position. Breathe out normally. Place the mouthpiece in your mouth and seal your lips tightly around it. Breathe in slowly and as deeply as possible, raising the piston or the ball toward the top of the column. Hold your breath for 3-5 seconds or for as long as possible. Allow the piston or ball to fall to the bottom of the column. Remove the mouthpiece from your mouth and breathe out normally. Rest for a few seconds and repeat Steps 1 through 7 at least 10 times every 1-2 hours when you are awake. Take your time and take a few normal breaths between deep breaths. The spirometer may include an indicator to show your best effort. Use the indicator as a goal to work toward during each repetition. After each set of 10 deep breaths, practice coughing to be sure your lungs are clear. If you have an incision (the cut made at the time of surgery), support your incision when coughing by placing a pillow or rolled up towels firmly against it. Once you are able to get out of bed, walk around indoors and cough well. You may stop using the incentive spirometer when instructed by your caregiver.  RISKS AND COMPLICATIONS Take your time so you do not get dizzy or light-headed. If you are in pain, you may need to take or ask for pain medication before doing incentive spirometry. It is harder to take a deep breath if you are having pain. AFTER USE Rest and breathe slowly and easily. It can be helpful to keep track of a log of your progress. Your caregiver can provide you with a simple table to help with this. If you are using  the spirometer at home, follow these instructions: SEEK MEDICAL CARE IF:  You are having difficultly using the spirometer. You have trouble using the spirometer as often as instructed. Your pain medication is not giving enough relief while using the spirometer. You develop fever of 100.5 F (38.1 C) or higher. SEEK IMMEDIATE MEDICAL CARE IF:  You cough up bloody sputum that had not been  present before. You develop fever of 102 F (38.9 C) or greater. You develop worsening pain at or near the incision site. MAKE SURE YOU:  Understand these instructions. Will watch your condition. Will get help right away if you are not doing well or get worse. Document Released: 07/25/2006 Document Revised: 06/06/2011 Document Reviewed: 09/25/2006 [facility] Patient Information 2014 [place], [place].   ________________________________________________________________________

## 2021-01-01 ENCOUNTER — Encounter (HOSPITAL_COMMUNITY): Payer: Self-pay

## 2021-01-01 ENCOUNTER — Other Ambulatory Visit: Payer: Self-pay

## 2021-01-01 ENCOUNTER — Encounter (HOSPITAL_COMMUNITY)
Admission: RE | Admit: 2021-01-01 | Discharge: 2021-01-01 | Disposition: A | Discharge status: Home / Self Care | Payer: No Typology Code available for payment source | Source: Ambulatory Visit | Attending: Orthopedic Surgery | Admitting: Orthopedic Surgery

## 2021-01-01 DIAGNOSIS — Z87891 Personal history of nicotine dependence: Secondary | ICD-10-CM | POA: Insufficient documentation

## 2021-01-01 DIAGNOSIS — M1712 Unilateral primary osteoarthritis, left knee: Secondary | ICD-10-CM | POA: Insufficient documentation

## 2021-01-01 DIAGNOSIS — Z01818 Encounter for other preprocedural examination: Secondary | ICD-10-CM | POA: Diagnosis present

## 2021-01-01 DIAGNOSIS — G4730 Sleep apnea, unspecified: Secondary | ICD-10-CM | POA: Insufficient documentation

## 2021-01-01 DIAGNOSIS — Z79899 Other long term (current) drug therapy: Secondary | ICD-10-CM | POA: Insufficient documentation

## 2021-01-01 DIAGNOSIS — I2510 Atherosclerotic heart disease of native coronary artery without angina pectoris: Secondary | ICD-10-CM | POA: Diagnosis not present

## 2021-01-01 DIAGNOSIS — I10 Essential (primary) hypertension: Secondary | ICD-10-CM | POA: Insufficient documentation

## 2021-01-01 DIAGNOSIS — M069 Rheumatoid arthritis, unspecified: Secondary | ICD-10-CM | POA: Diagnosis not present

## 2021-01-01 HISTORY — DX: Rheumatoid arthritis, unspecified: M06.9

## 2021-01-01 HISTORY — DX: Sleep apnea, unspecified: G47.30

## 2021-01-01 HISTORY — DX: Prediabetes: R73.03

## 2021-01-01 LAB — TYPE AND SCREEN
ABO/RH(D): A POS
Antibody Screen: NEGATIVE

## 2021-01-01 LAB — COMPREHENSIVE METABOLIC PANEL
ALT: 27 U/L (ref 0–44)
AST: 36 U/L (ref 15–41)
Albumin: 4 g/dL (ref 3.5–5.0)
Alkaline Phosphatase: 85 U/L (ref 38–126)
Anion gap: 7 (ref 5–15)
BUN: 10 mg/dL (ref 6–20)
CO2: 29 mmol/L (ref 22–32)
Calcium: 9.2 mg/dL (ref 8.9–10.3)
Chloride: 99 mmol/L (ref 98–111)
Creatinine, Ser: 1.02 mg/dL (ref 0.61–1.24)
GFR, Estimated: >60 mL/min (ref >60)
Glucose, Bld: 116 mg/dL — ABNORMAL HIGH (ref 70–99)
Potassium: 3.1 mmol/L — ABNORMAL LOW (ref 3.5–5.1)
Sodium: 135 mmol/L (ref 135–145)
Total Bilirubin: 0.6 mg/dL (ref 0.3–1.2)
Total Protein: 8.2 g/dL — ABNORMAL HIGH (ref 6.5–8.1)

## 2021-01-01 LAB — HEMOGLOBIN A1C
Hgb A1c MFr Bld: 6.7 % — ABNORMAL HIGH (ref 4.8–5.6)
Mean Plasma Glucose: 145.59 mg/dL

## 2021-01-01 LAB — CBC
HCT: 46.1 % (ref 39.0–52.0)
Hemoglobin: 14.7 g/dL (ref 13.0–17.0)
MCH: 27.7 pg (ref 26.0–34.0)
MCHC: 31.9 g/dL (ref 30.0–36.0)
MCV: 87 fL (ref 80.0–100.0)
Platelets: 290 10*3/uL (ref 150–400)
RBC: 5.3 MIL/uL (ref 4.22–5.81)
RDW: 14.7 % (ref 11.5–15.5)
WBC: 7.7 10*3/uL (ref 4.0–10.5)
nRBC: 0 % (ref 0.0–0.2)

## 2021-01-01 LAB — SURGICAL PCR SCREEN
MRSA, PCR: NEGATIVE
Staphylococcus aureus: NEGATIVE

## 2021-01-01 NOTE — Progress Notes (Addendum)
COVID test- 01/08/21  [facility] - [facility] Cardiologist - Dr. Mendel Ryder but now uses th [facility] cardiologist  Chest x-ray - no EKG - 01/01/21-chart Stress Test - no ECHO - no Cardiac Cath - 2012 with 1 stent, 2015 with 2 stents Pacemaker/ICD device last checked:NA  Sleep Study - yes CPAP - yes  Fasting Blood Sugar - Pt newly Dx. Doesn't test Checks Blood Sugar _____ times a day  Blood Thinner Instructions:NA Aspirin Instructions: Last Dose:  Anesthesia review: yes  Patient denies shortness of breath, fever, cough and chest pain at PAT appointment Pt reports no SOB climbing stairs, doing housework or with ADLs.  Patient verbalized understanding of instructions that were given to them at the PAT appointment. Patient was also instructed that they will need to review over the PAT instructions again at home before surgery. Yes Pt started to gag on post nasal drip and was trying to throw up. CBG was not obtained at  that time.

## 2021-01-05 NOTE — Progress Notes (Signed)
Anesthesia Chart Review   Case: [id number] Date/Time: 01/12/21 0700   Procedure: TOTAL KNEE ARTHROPLASTY (Left: Knee)   Anesthesia type: Spinal   Pre-op diagnosis: Left knee osteoarthritis   Location: [facility] ROOM 09 / [facility]   Surgeons: Durene Romans, MD       DISCUSSION:55 y.o. former smoker with h/o HTN, sleep apnea with cpap, DM II, CAD (DES x2 to RCA in March 2015), RA, left knee OA scheduled for above procedure 01/12/2021 with Dr. Durene Romans.   Pt seen by cardiology 12/03/2020 for preoperative evaluation.  Per OV note, "He would like cardiac clearance  for L knee surgery. His RCRI score is 1 due to previous ischemic heart disease.  He is has no ongoing anginal sx or anginal equivalent and is able to achieve 4  mets, thus no pre-surgical stress test is necessary. We will work on risk factor  modification including better BP and lipid control."  Anticipate pt can proceed with planned procedure barring acute status change.   VS: BP (!) 145/97   Pulse 93   Temp 37.1 C (Oral)   Resp 20   Ht 5\' 8"  (1.727 m)   SpO2 97%   BMI 32.75 kg/m   PROVIDERS: , MD is Cardiologist   Lyn Records, MD is Cardiologist  LABS: Labs reviewed: Acceptable for surgery. (all labs ordered are listed, but only abnormal results are displayed)  Labs Reviewed  COMPREHENSIVE METABOLIC PANEL - Abnormal; Notable for the following components:      Result Value   Potassium 3.1 (*)    Glucose, Bld 116 (*)    Total Protein 8.2 (*)    All other components within normal limits  HEMOGLOBIN A1C - Abnormal; Notable for the following components:   Hgb A1c MFr Bld 6.7 (*)    All other components within normal limits  SURGICAL PCR SCREEN  CBC  TYPE AND SCREEN     IMAGES:   EKG: 01/01/2021 Rate 91 bpm  NSR Possible anterior infarct, age undetermined  CV:  Past Medical History:  Diagnosis Date   Anxiety disorder    Coronary artery disease    a. s/p Inferior MI 2010/2012 with RCA  stenting;  b.05/2013 Cath/PCI: LM nl LAD 47m, LCX nl, RCA 29m ISR (4.0x20 Promus Premier DES), 70d ISR (3.0x16 Promus Premier DES), EF 55%   Hyperlipidemia    Hypertension    Myocardial infarction (HCC) 2015   Pre-diabetes    Rheumatoid arthritis (HCC)    Sleep apnea    C-Pap    Past Surgical History:  Procedure Laterality Date   CARDIAC CATHETERIZATION  2012   1 stent   JOINT REPLACEMENT Right 2017   LEFT HEART CATHETERIZATION WITH CORONARY ANGIOGRAM N/A 06/20/2013   2 stents    MEDICATIONS:  hydrochlorothiazide (HYDRODIURIL) 25 MG tablet   OVER THE COUNTER MEDICATION   OVER THE COUNTER MEDICATION   rosuvastatin (CRESTOR) 40 MG tablet   No current facility-administered medications for this encounter.     06/22/2013 Ward, PA-C [facility] Pre-Surgical Testing [phone]

## 2021-01-06 NOTE — Anesthesia Preprocedure Evaluation (Addendum)
Anesthesia Evaluation  Patient identified by MRN, date of birth, ID band Patient awake    Reviewed: Allergy & Precautions, NPO status , Patient's Chart, lab work & pertinent test results  History of Anesthesia Complications Negative for: history of anesthetic complications  Airway Mallampati: III  TM Distance: >3 FB Neck ROM: Full    Dental no notable dental hx.    Pulmonary sleep apnea and Continuous Positive Airway Pressure Ventilation , former smoker,    Pulmonary exam normal        Cardiovascular hypertension, Pt. on medications + CAD, + Past MI (2015) and + Cardiac Stents  Normal cardiovascular exam     Neuro/Psych Anxiety negative neurological ROS     GI/Hepatic negative GI ROS, Neg liver ROS,   Endo/Other  K 3.1  Renal/GU negative Renal ROS  negative genitourinary   Musculoskeletal  (+) Arthritis , Rheumatoid disorders,    Abdominal   Peds  Hematology negative hematology ROS (+)   Anesthesia Other Findings Day of surgery medications reviewed with patient.  Reproductive/Obstetrics negative OB ROS                           Anesthesia Physical Anesthesia Plan  ASA: 2  Anesthesia Plan: Spinal   Post-op Pain Management:  Regional for Post-op pain   Induction:   PONV Risk Score and Plan: 2 and Treatment may vary due to age or medical condition, Ondansetron, Propofol infusion, Dexamethasone and Midazolam  Airway Management Planned: Natural Airway and Simple Face Mask  Additional Equipment: None  Intra-op Plan:   Post-operative Plan:   Informed Consent: I have reviewed the patients History and Physical, chart, labs and discussed the procedure including the risks, benefits and alternatives for the proposed anesthesia with the patient or authorized representative who has indicated his/her understanding and acceptance.       Plan Discussed with: CRNA  Anesthesia Plan  Comments: (See PAT note 01/01/2021, Jodell Cipro Ward, PA-C)      Anesthesia Quick Evaluation

## 2021-01-11 NOTE — H&P (Signed)
TOTAL KNEE ADMISSION H&P  Patient is being admitted for left total knee arthroplasty.  Subjective:  Chief Complaint:left knee pain.  HPI: SANUEL [name], 55 y.o. male, has a history of pain and functional disability in the left knee due to arthritis and has failed non-surgical conservative treatments for greater than 12 weeks to includeNSAID's and/or analgesics, corticosteriod injections, and activity modification.  Onset of symptoms was gradual, starting 2 years ago with gradually worsening course since that time. The patient noted no past surgery on the left knee(s).  Patient currently rates pain in the left knee(s) at 8 out of 10 with activity. Patient has worsening of pain with activity and weight bearing, pain that interferes with activities of daily living, and pain with passive range of motion.  Patient has evidence of joint space narrowing by imaging studies.  There is no active infection.  Patient Active Problem List   Diagnosis Date Noted   Unstable angina (HCC) 06/21/2013   NSTEMI (non-ST elevated myocardial infarction) (HCC) 06/21/2013   Hypertension    Anxiety disorder    Hyperlipidemia    Angina pectoris (HCC) 06/20/2013   Chest pain 06/20/2013   CAD (coronary artery disease) 06/20/2013   Intermediate coronary syndrome (HCC) 06/20/2013   Past Medical History:  Diagnosis Date   Anxiety disorder    Coronary artery disease    a. s/p Inferior MI 2010/2012 with RCA stenting;  b.05/2013 Cath/PCI: LM nl LAD 69m, LCX nl, RCA 52m ISR (4.0x20 Promus Premier DES), 70d ISR (3.0x16 Promus Premier DES), EF 55%   Hyperlipidemia    Hypertension    Myocardial infarction (HCC) 2015   Pre-diabetes    Rheumatoid arthritis (HCC)    Sleep apnea    C-Pap    Past Surgical History:  Procedure Laterality Date   CARDIAC CATHETERIZATION  2012   1 stent   JOINT REPLACEMENT Right 2017   LEFT HEART CATHETERIZATION WITH CORONARY ANGIOGRAM N/A 06/20/2013   2 stents    No current  facility-administered medications for this encounter.   Current Outpatient Medications  Medication Sig Dispense Refill Last Dose   hydrochlorothiazide (HYDRODIURIL) 25 MG tablet Take 25 mg by mouth daily.      OVER THE COUNTER MEDICATION Take 1 tablet by mouth daily. Super joint support      OVER THE COUNTER MEDICATION Take 1 tablet by mouth daily. Heal and soothe      rosuvastatin (CRESTOR) 40 MG tablet Take 40 mg by mouth daily.      No Known Allergies  Social History   Tobacco Use   Smoking status: Former    Packs/day: 0.75    Years: 20.00    Pack years: 15.00    Types: Cigarettes    Quit date: 06/27/2010    Years since quitting: 10.5   Smokeless tobacco: Never  Substance Use Topics   Alcohol use: Not Currently    Family History  Problem Relation Age of Onset   Diabetes Mother    Heart disease Father    Heart attack Father      Review of Systems  Constitutional:  Negative for chills and fever.  Respiratory:  Negative for cough and shortness of breath.   Cardiovascular:  Negative for chest pain.  Gastrointestinal:  Negative for nausea and vomiting.  Musculoskeletal:  Positive for arthralgias.    Objective:  Physical Exam Well nourished and well developed. General: Alert and oriented x3, cooperative and pleasant, no acute distress. Head: normocephalic, atraumatic, neck supple. Eyes: EOMI.  Musculoskeletal: Left Knee: Mild tenderness to palpation about the lateral joint line of the left knee. AROM 0-120 degrees. No effusion noted. No instability. Mild patellofemoral crepitus. Sensation and motor function intact in LE. Distal pulses 2+. Calves soft and nontender.  Calves soft and nontender. Motor function intact in LE. Strength 5/5 LE bilaterally. Neuro: Distal pulses 2+. Sensation to light touch intact in LE.  Vital signs in last 24 hours:    Labs:   Estimated body mass index is 32.75 kg/m as calculated from the following:   Height as of 01/01/21: 5\' 8"   (1.727 m).   Weight as of 11/04/13: 97.7 kg.   Imaging Review Plain radiographs demonstrate severe degenerative joint disease of the left knee(s). The overall alignment isneutral. The bone quality appears to be adequate for age and reported activity level.      Assessment/Plan:  End stage arthritis, left knee   The patient history, physical examination, clinical judgment of the provider and imaging studies are consistent with end stage degenerative joint disease of the left knee(s) and total knee arthroplasty is deemed medically necessary. The treatment options including medical management, injection therapy arthroscopy and arthroplasty were discussed at length. The risks and benefits of total knee arthroplasty were presented and reviewed. The risks due to aseptic loosening, infection, stiffness, patella tracking problems, thromboembolic complications and other imponderables were discussed. The patient acknowledged the explanation, agreed to proceed with the plan and consent was signed. Patient is being admitted for inpatient treatment for surgery, pain control, PT, OT, prophylactic antibiotics, VTE prophylaxis, progressive ambulation and ADL's and discharge planning. The patient is planning to be discharged  home.  Therapy Plans: outpatient therapy in [place] Disposition: Home with wife Planned DVT Prophylaxis: aspirin 81mg  BID DME needed: none [facility] name - Cardiologist: Dr. , clearance received TXA: IV Allergies: NKDA Anesthesia Concerns: none BMI: 38.5 Last HgbA1c: Not diabetic  Other: - 6 beers/day per cardiology note - States he has stopped drinking - Oxycodone, robaxin, celebrex, tylenol - SDD   Patient's anticipated LOS is less than 2 midnights, meeting these requirements: - Younger than 75 - Lives within 1 hour of care - Has a competent adult at home to recover with post-op recover - NO history of  - Chronic pain requiring opiods  -  Diabetes  - Coronary Artery Disease  - Heart failure  - Heart attack  - Stroke  - DVT/VTE  - Cardiac arrhythmia  - Respiratory Failure/COPD  - Renal failure  - Anemia  - Advanced Liver disease  Reita May, PA-C Orthopedic Surgery [facility] [place] [phone]

## 2021-01-12 ENCOUNTER — Ambulatory Visit (HOSPITAL_COMMUNITY): Payer: No Typology Code available for payment source | Admitting: Physician Assistant

## 2021-01-12 ENCOUNTER — Ambulatory Visit (HOSPITAL_COMMUNITY)
Admission: RE | Admit: 2021-01-12 | Discharge: 2021-01-12 | Disposition: A | Discharge status: Home / Self Care | Payer: No Typology Code available for payment source | Attending: Orthopedic Surgery | Admitting: Orthopedic Surgery

## 2021-01-12 ENCOUNTER — Encounter (HOSPITAL_COMMUNITY)
Admission: RE | Disposition: A | Discharge status: Home / Self Care | Payer: Self-pay | Source: Home / Self Care | Attending: Orthopedic Surgery

## 2021-01-12 ENCOUNTER — Encounter (HOSPITAL_COMMUNITY): Payer: Self-pay | Admitting: Orthopedic Surgery

## 2021-01-12 ENCOUNTER — Ambulatory Visit (HOSPITAL_COMMUNITY): Payer: No Typology Code available for payment source | Admitting: Anesthesiology

## 2021-01-12 DIAGNOSIS — M25462 Effusion, left knee: Secondary | ICD-10-CM | POA: Insufficient documentation

## 2021-01-12 DIAGNOSIS — M1712 Unilateral primary osteoarthritis, left knee: Secondary | ICD-10-CM | POA: Insufficient documentation

## 2021-01-12 DIAGNOSIS — Z87891 Personal history of nicotine dependence: Secondary | ICD-10-CM | POA: Insufficient documentation

## 2021-01-12 DIAGNOSIS — M6588 Other synovitis and tenosynovitis, other site: Secondary | ICD-10-CM | POA: Diagnosis not present

## 2021-01-12 DIAGNOSIS — Z96652 Presence of left artificial knee joint: Secondary | ICD-10-CM

## 2021-01-12 DIAGNOSIS — M25762 Osteophyte, left knee: Secondary | ICD-10-CM | POA: Diagnosis not present

## 2021-01-12 DIAGNOSIS — M238X2 Other internal derangements of left knee: Secondary | ICD-10-CM | POA: Insufficient documentation

## 2021-01-12 HISTORY — PX: TOTAL KNEE ARTHROPLASTY: SHX125

## 2021-01-12 LAB — GLUCOSE, CAPILLARY
Glucose-Capillary: 111 mg/dL — ABNORMAL HIGH (ref 70–99)
Glucose-Capillary: 120 mg/dL — ABNORMAL HIGH (ref 70–99)

## 2021-01-12 LAB — ABO/RH: ABO/RH(D): A POS

## 2021-01-12 SURGERY — ARTHROPLASTY, KNEE, TOTAL
Anesthesia: Spinal | Site: Knee | Laterality: Left

## 2021-01-12 MED ORDER — CELECOXIB 200 MG PO CAPS: 200.0000 mg | ORAL_CAPSULE | Freq: Two times a day (BID) | ORAL | 0 refills | Status: AC

## 2021-01-12 MED ORDER — TRANEXAMIC ACID-NACL 1000-0.7 MG/100ML-% IV SOLN
1000.0000 mg | Freq: Once | INTRAVENOUS | Status: AC
  Administered 2021-01-12: 1000 mg via INTRAVENOUS

## 2021-01-12 MED ORDER — LACTATED RINGERS IV SOLN: INTRAVENOUS | Status: DC

## 2021-01-12 MED ORDER — CEFAZOLIN SODIUM-DEXTROSE 2-4 GM/100ML-% IV SOLN
2.0000 g | INTRAVENOUS | Status: AC
  Administered 2021-01-12: 2 g via INTRAVENOUS
  Filled 2021-01-12: qty 100

## 2021-01-12 MED ORDER — LACTATED RINGERS IV BOLUS: 250.0000 mL | Freq: Once | INTRAVENOUS | Status: DC

## 2021-01-12 MED ORDER — DEXAMETHASONE SODIUM PHOSPHATE 10 MG/ML IJ SOLN
8.0000 mg | Freq: Once | INTRAMUSCULAR | Status: AC
  Administered 2021-01-12: 10 mg via INTRAVENOUS

## 2021-01-12 MED ORDER — PROPOFOL 10 MG/ML IV BOLUS
INTRAVENOUS | Status: DC | PRN
  Administered 2021-01-12: 10 mg via INTRAVENOUS

## 2021-01-12 MED ORDER — PHENYLEPHRINE 40 MCG/ML (10ML) SYRINGE FOR IV PUSH (FOR BLOOD PRESSURE SUPPORT)
PREFILLED_SYRINGE | INTRAVENOUS | Status: AC
  Filled 2021-01-12: qty 10

## 2021-01-12 MED ORDER — OXYCODONE HCL 5 MG PO TABS: 5.0000 mg | ORAL_TABLET | ORAL | Status: DC | PRN

## 2021-01-12 MED ORDER — PROPOFOL 500 MG/50ML IV EMUL
INTRAVENOUS | Status: DC | PRN
  Administered 2021-01-12: 100 ug/kg/min via INTRAVENOUS

## 2021-01-12 MED ORDER — ASPIRIN 81 MG PO CHEW: 81.0000 mg | CHEWABLE_TABLET | Freq: Two times a day (BID) | ORAL | 0 refills | Status: AC

## 2021-01-12 MED ORDER — CLONIDINE HCL (ANALGESIA) 100 MCG/ML EP SOLN
EPIDURAL | Status: DC | PRN
  Administered 2021-01-12: 100 ug

## 2021-01-12 MED ORDER — DOCUSATE SODIUM 100 MG PO CAPS: 100.0000 mg | ORAL_CAPSULE | Freq: Two times a day (BID) | ORAL | Status: AC

## 2021-01-12 MED ORDER — SODIUM CHLORIDE (PF) 0.9 % IJ SOLN
INTRAMUSCULAR | Status: DC | PRN
  Administered 2021-01-12: 30 mL

## 2021-01-12 MED ORDER — PROPOFOL 500 MG/50ML IV EMUL
INTRAVENOUS | Status: AC
  Filled 2021-01-12: qty 50

## 2021-01-12 MED ORDER — PROMETHAZINE HCL 25 MG/ML IJ SOLN: 6.2500 mg | INTRAMUSCULAR | Status: DC | PRN

## 2021-01-12 MED ORDER — ONDANSETRON HCL 4 MG/2ML IJ SOLN
INTRAMUSCULAR | Status: DC | PRN
  Administered 2021-01-12: 4 mg via INTRAVENOUS

## 2021-01-12 MED ORDER — OXYCODONE HCL 5 MG/5ML PO SOLN: 5.0000 mg | Freq: Once | ORAL | Status: AC | PRN

## 2021-01-12 MED ORDER — METHOCARBAMOL 500 MG PO TABS: 500.0000 mg | ORAL_TABLET | Freq: Four times a day (QID) | ORAL | 0 refills | Status: DC | PRN

## 2021-01-12 MED ORDER — ORAL CARE MOUTH RINSE: 15.0000 mL | Freq: Once | OROMUCOSAL | Status: AC

## 2021-01-12 MED ORDER — CHLORHEXIDINE GLUCONATE 0.12 % MT SOLN
15.0000 mL | Freq: Once | OROMUCOSAL | Status: AC
  Administered 2021-01-12: 15 mL via OROMUCOSAL

## 2021-01-12 MED ORDER — FENTANYL CITRATE (PF) 100 MCG/2ML IJ SOLN
INTRAMUSCULAR | Status: DC | PRN
  Administered 2021-01-12: 50 ug via INTRAVENOUS

## 2021-01-12 MED ORDER — OXYCODONE HCL 5 MG PO TABS: 10.0000 mg | ORAL_TABLET | ORAL | Status: DC | PRN

## 2021-01-12 MED ORDER — OXYCODONE HCL 5 MG PO TABS: 5.0000 mg | ORAL_TABLET | Freq: Four times a day (QID) | ORAL | 0 refills | Status: DC | PRN

## 2021-01-12 MED ORDER — BUPIVACAINE-EPINEPHRINE (PF) 0.25% -1:200000 IJ SOLN
INTRAMUSCULAR | Status: DC | PRN
  Administered 2021-01-12: 30 mL

## 2021-01-12 MED ORDER — ONDANSETRON HCL 4 MG/2ML IJ SOLN
INTRAMUSCULAR | Status: AC
  Filled 2021-01-12: qty 2

## 2021-01-12 MED ORDER — FENTANYL CITRATE (PF) 100 MCG/2ML IJ SOLN
INTRAMUSCULAR | Status: AC
  Filled 2021-01-12: qty 2

## 2021-01-12 MED ORDER — FENTANYL CITRATE PF 50 MCG/ML IJ SOSY: 25.0000 ug | PREFILLED_SYRINGE | INTRAMUSCULAR | Status: DC | PRN

## 2021-01-12 MED ORDER — MIDAZOLAM HCL 5 MG/5ML IJ SOLN
INTRAMUSCULAR | Status: DC | PRN
  Administered 2021-01-12 (×2): 1 mg via INTRAVENOUS

## 2021-01-12 MED ORDER — LACTATED RINGERS IV BOLUS
500.0000 mL | Freq: Once | INTRAVENOUS | Status: AC
  Administered 2021-01-12: 500 mL via INTRAVENOUS

## 2021-01-12 MED ORDER — PHENYLEPHRINE HCL-NACL 20-0.9 MG/250ML-% IV SOLN
INTRAVENOUS | Status: DC | PRN
  Administered 2021-01-12: 35 ug/min via INTRAVENOUS

## 2021-01-12 MED ORDER — PHENYLEPHRINE 40 MCG/ML (10ML) SYRINGE FOR IV PUSH (FOR BLOOD PRESSURE SUPPORT)
PREFILLED_SYRINGE | INTRAVENOUS | Status: DC | PRN
  Administered 2021-01-12: 80 ug via INTRAVENOUS

## 2021-01-12 MED ORDER — OXYCODONE HCL 5 MG PO TABS
ORAL_TABLET | ORAL | Status: AC
  Filled 2021-01-12: qty 1

## 2021-01-12 MED ORDER — OXYCODONE HCL 5 MG PO TABS
5.0000 mg | ORAL_TABLET | Freq: Once | ORAL | Status: AC | PRN
Start: 2021-01-12 — End: 2021-01-12
  Administered 2021-01-12: 5 mg via ORAL

## 2021-01-12 MED ORDER — POVIDONE-IODINE 10 % EX SWAB: 2.0000 "application " | Freq: Once | CUTANEOUS | Status: DC

## 2021-01-12 MED ORDER — TRANEXAMIC ACID-NACL 1000-0.7 MG/100ML-% IV SOLN
INTRAVENOUS | Status: AC
  Filled 2021-01-12: qty 100

## 2021-01-12 MED ORDER — BUPIVACAINE-EPINEPHRINE (PF) 0.25% -1:200000 IJ SOLN
INTRAMUSCULAR | Status: AC
  Filled 2021-01-12: qty 30

## 2021-01-12 MED ORDER — METHOCARBAMOL 500 MG PO TABS: 500.0000 mg | ORAL_TABLET | Freq: Four times a day (QID) | ORAL | Status: DC | PRN

## 2021-01-12 MED ORDER — DEXAMETHASONE SODIUM PHOSPHATE 10 MG/ML IJ SOLN
INTRAMUSCULAR | Status: AC
  Filled 2021-01-12: qty 1

## 2021-01-12 MED ORDER — PROPOFOL 1000 MG/100ML IV EMUL
INTRAVENOUS | Status: AC
  Filled 2021-01-12: qty 100

## 2021-01-12 MED ORDER — TRANEXAMIC ACID-NACL 1000-0.7 MG/100ML-% IV SOLN
1000.0000 mg | INTRAVENOUS | Status: AC
  Administered 2021-01-12: 1000 mg via INTRAVENOUS
  Filled 2021-01-12: qty 100

## 2021-01-12 MED ORDER — BUPIVACAINE-EPINEPHRINE (PF) 0.5% -1:200000 IJ SOLN
INTRAMUSCULAR | Status: DC | PRN
  Administered 2021-01-12: 15 mL via PERINEURAL

## 2021-01-12 MED ORDER — METHOCARBAMOL 500 MG IVPB - SIMPLE MED: 500.0000 mg | Freq: Four times a day (QID) | INTRAVENOUS | Status: DC | PRN

## 2021-01-12 MED ORDER — CEFAZOLIN SODIUM-DEXTROSE 2-4 GM/100ML-% IV SOLN: 2.0000 g | Freq: Four times a day (QID) | INTRAVENOUS | Status: DC

## 2021-01-12 MED ORDER — KETOROLAC TROMETHAMINE 30 MG/ML IJ SOLN
INTRAMUSCULAR | Status: AC
  Filled 2021-01-12: qty 1

## 2021-01-12 MED ORDER — BUPIVACAINE IN DEXTROSE 0.75-8.25 % IT SOLN
INTRATHECAL | Status: DC | PRN
Start: 2021-01-12 — End: 2021-01-12
  Administered 2021-01-12: 1.6 mL via INTRATHECAL

## 2021-01-12 MED ORDER — KETOROLAC TROMETHAMINE 30 MG/ML IJ SOLN
INTRAMUSCULAR | Status: DC | PRN
  Administered 2021-01-12: 30 mg via INTRA_ARTICULAR

## 2021-01-12 MED ORDER — MIDAZOLAM HCL 2 MG/2ML IJ SOLN
INTRAMUSCULAR | Status: AC
  Filled 2021-01-12: qty 2

## 2021-01-12 MED ORDER — ACETAMINOPHEN 500 MG PO TABS
1000.0000 mg | ORAL_TABLET | Freq: Once | ORAL | Status: AC
  Administered 2021-01-12: 1000 mg via ORAL
  Filled 2021-01-12: qty 2

## 2021-01-12 SURGICAL SUPPLY — 53 items
ADH SKN CLS APL DERMABOND .7 (GAUZE/BANDAGES/DRESSINGS) ×1
ATTUNE MED ANAT PAT 38 KNEE (Knees) ×2 IMPLANT
ATTUNE PSFEM LTSZ6 NARCEM KNEE (Femur) ×2 IMPLANT
ATTUNE PSRP INSR SZ6 7 KNEE (Insert) ×2 IMPLANT
BAG COUNTER SPONGE SURGICOUNT (BAG) IMPLANT
BAG SPEC THK2 15X12 ZIP CLS (MISCELLANEOUS)
BAG SPNG CNTER NS LX DISP (BAG)
BAG ZIPLOCK 12X15 (MISCELLANEOUS) IMPLANT
BASE TIBIA ATTUNE KNEE SYS SZ6 (Knees) ×1 IMPLANT
BLADE SAW SGTL 11.0X1.19X90.0M (BLADE) IMPLANT
BLADE SAW SGTL 13.0X1.19X90.0M (BLADE) ×2 IMPLANT
BLADE SURG SZ10 CARB STEEL (BLADE) ×4 IMPLANT
BNDG ELASTIC 6X5.8 VLCR STR LF (GAUZE/BANDAGES/DRESSINGS) ×2 IMPLANT
BOWL SMART MIX CTS (DISPOSABLE) ×2 IMPLANT
BSPLAT TIB 6 CMNT ROT PLAT STR (Knees) ×1 IMPLANT
CEMENT HV SMART SET (Cement) ×4 IMPLANT
CUFF TOURN SGL QUICK 34 (TOURNIQUET CUFF) ×2
CUFF TRNQT CYL 34X4.125X (TOURNIQUET CUFF) ×1 IMPLANT
DECANTER SPIKE VIAL GLASS SM (MISCELLANEOUS) ×4 IMPLANT
DERMABOND ADVANCED (GAUZE/BANDAGES/DRESSINGS) ×1
DERMABOND ADVANCED .7 DNX12 (GAUZE/BANDAGES/DRESSINGS) ×1 IMPLANT
DRAPE INCISE IOBAN 66X45 STRL (DRAPES) ×2 IMPLANT
DRAPE U-SHAPE 47X51 STRL (DRAPES) ×2 IMPLANT
DRESSING AQUACEL AG SP 3.5X10 (GAUZE/BANDAGES/DRESSINGS) ×1 IMPLANT
DRSG AQUACEL AG SP 3.5X10 (GAUZE/BANDAGES/DRESSINGS) ×2
DURAPREP 26ML APPLICATOR (WOUND CARE) ×4 IMPLANT
ELECT REM PT RETURN 15FT ADLT (MISCELLANEOUS) ×2 IMPLANT
GLOVE SURG ENC MOIS LTX SZ6 (GLOVE) ×2 IMPLANT
GLOVE SURG ENC MOIS LTX SZ7 (GLOVE) ×2 IMPLANT
GLOVE SURG POLY ORTHO LF SZ6.5 (GLOVE) ×2 IMPLANT
GLOVE SURG UNDER POLY LF SZ7.5 (GLOVE) ×2 IMPLANT
GOWN STRL REUS W/TWL LRG LVL3 (GOWN DISPOSABLE) ×2 IMPLANT
HANDPIECE INTERPULSE COAX TIP (DISPOSABLE) ×2
HOLDER FOLEY CATH W/STRAP (MISCELLANEOUS) IMPLANT
MANIFOLD NEPTUNE II (INSTRUMENTS) ×2 IMPLANT
NDL SAFETY ECLIPSE 18X1.5 (NEEDLE) IMPLANT
NEEDLE HYPO 18GX1.5 SHARP (NEEDLE)
NS IRRIG 1000ML POUR BTL (IV SOLUTION) ×2 IMPLANT
PACK TOTAL KNEE CUSTOM (KITS) ×2 IMPLANT
PROTECTOR NERVE ULNAR (MISCELLANEOUS) ×2 IMPLANT
SET HNDPC FAN SPRY TIP SCT (DISPOSABLE) ×1 IMPLANT
SET PAD KNEE POSITIONER (MISCELLANEOUS) ×2 IMPLANT
SUT MNCRL AB 4-0 PS2 18 (SUTURE) ×2 IMPLANT
SUT STRATAFIX PDS+ 0 24IN (SUTURE) ×2 IMPLANT
SUT VIC AB 1 CT1 36 (SUTURE) ×2 IMPLANT
SUT VIC AB 2-0 CT1 27 (SUTURE) ×6
SUT VIC AB 2-0 CT1 TAPERPNT 27 (SUTURE) ×3 IMPLANT
SYR 3ML LL SCALE MARK (SYRINGE) ×2 IMPLANT
TIBIA ATTUNE KNEE SYS BASE SZ6 (Knees) ×2 IMPLANT
TRAY FOLEY MTR SLVR 16FR STAT (SET/KITS/TRAYS/PACK) ×2 IMPLANT
TUBE SUCTION HIGH CAP CLEAR NV (SUCTIONS) ×2 IMPLANT
WATER STERILE IRR 1000ML POUR (IV SOLUTION) ×4 IMPLANT
WRAP KNEE MAXI GEL POST OP (GAUZE/BANDAGES/DRESSINGS) ×2 IMPLANT

## 2021-01-12 NOTE — Transfer of Care (Signed)
Immediate Anesthesia Transfer of Care Note  Patient: Jacob [name]  Procedure(s) Performed: TOTAL KNEE ARTHROPLASTY (Left: Knee)  Patient Location: PACU  Anesthesia Type:Spinal  Level of Consciousness: awake, alert  and oriented  Airway & Oxygen Therapy: Patient Spontanous Breathing and Patient connected to face mask oxygen  Post-op Assessment: Report given to RN and Post -op Vital signs reviewed and stable  Post vital signs: Reviewed and stable  Last Vitals:  Vitals Value Taken Time  BP 146/88 01/12/21 0907  Temp    Pulse 95 01/12/21 0911  Resp 22 01/12/21 0911  SpO2 100 % 01/12/21 0911  Vitals shown include unvalidated device data.  Last Pain:  Vitals:   01/12/21 0557  TempSrc: Oral  PainSc:       Patients Stated Pain Goal: 4 (01/12/21 0546)  Complications: No notable events documented.

## 2021-01-12 NOTE — Progress Notes (Signed)
Orthopedic Tech Progress Note Patient Details:  Jacob [name] February 02, 1966 [id number]  Patient ID: Jacob [name], male   DOB: 1965-09-23, 55 y.o.   MRN: [id number]  Jacob [name] 01/12/2021, 3:21 PM Crutches and knee immobilizer given to patient in pacu per RN

## 2021-01-12 NOTE — Anesthesia Postprocedure Evaluation (Signed)
Anesthesia Post Note  Patient: Jacob [name]  Procedure(s) Performed: TOTAL KNEE ARTHROPLASTY (Left: Knee)     Patient location during evaluation: PACU Anesthesia Type: Spinal Level of consciousness: awake and alert and oriented Pain management: pain level controlled Vital Signs Assessment: post-procedure vital signs reviewed and stable Respiratory status: spontaneous breathing, nonlabored ventilation and respiratory function stable Cardiovascular status: blood pressure returned to baseline Postop Assessment: no apparent nausea or vomiting, spinal receding, no headache and no backache Anesthetic complications: no   No notable events documented.  Last Vitals:  Vitals:   01/12/21 1000 01/12/21 1015  BP: 133/84 131/87  Pulse: 88 81  Resp: 15 15  Temp:    SpO2: (!) 89% 97%    Last Pain:  Vitals:   01/12/21 0930  TempSrc:   PainSc: 0-No pain                 Shanda Howells

## 2021-01-12 NOTE — Op Note (Signed)
NAME:  Jacob [name]                      MEDICAL RECORD NO.:  [id number]                             FACILITY:  [facility]      PHYSICIAN:  Madlyn Frankel. Charlann Boxer, M.D.  DATE OF BIRTH:  04-06-1965      DATE OF PROCEDURE:  01/12/2021                                     OPERATIVE REPORT         PREOPERATIVE DIAGNOSIS:  Left knee osteoarthritis.      POSTOPERATIVE DIAGNOSIS:  Left knee osteoarthritis.      FINDINGS:  The patient was noted to have complete loss of cartilage and   bone-on-bone arthritis with associated osteophytes in the medial and patellofemoral compartments of   the knee with a significant synovitis and associated effusion.  The patient had failed months of conservative treatment including medications, injection therapy, activity modification.     PROCEDURE:  Left total knee replacement.      COMPONENTS USED:  DePuy Attune rotating platform posterior stabilized knee   system, a size 6N femur, 6 tibia, size 7 mm PS AOX insert, and 38 anatomic patellar   button.      SURGEON:  Madlyn Frankel. Charlann Boxer, M.D.      ASSISTANT:  Rosalene Billings, PA-C.      ANESTHESIA:  Regional and Spinal.      SPECIMENS:  None.      COMPLICATION:  None.      DRAINS:  None.  EBL: <150 cc      TOURNIQUET TIME:  35 min at 225 mmHg   The patient was stable to the recovery room.      INDICATION FOR PROCEDURE:  Jacob [name] is a 55 y.o. male patient of   mine.  The patient had been seen, evaluated, and treated for months conservatively in the   office with medication, activity modification, and injections.  The patient had   radiographic changes of bone-on-bone arthritis with endplate sclerosis and osteophytes noted.  Based on the radiographic changes and failed conservative measures, the patient   decided to proceed with definitive treatment, total knee replacement.  Risks of infection, DVT, component failure, need for revision surgery, neurovascular injury were reviewed in the office setting.  The  postop course was reviewed stressing the efforts to maximize post-operative satisfaction and function.  Consent was obtained for benefit of pain   relief.      PROCEDURE IN DETAIL:  The patient was brought to the operative theater.   Once adequate anesthesia, preoperative antibiotics, 2 gm of Ancef,1 gm of Tranexamic Acid, and 10 mg of Decadron administered, the patient was positioned supine with a left thigh tourniquet placed.  The  left lower extremity was prepped and draped in sterile fashion.  A time-   out was performed identifying the patient, planned procedure, and the appropriate extremity.      The left lower extremity was placed in the [facility] leg holder.  The leg was   exsanguinated, tourniquet elevated to 225 mmHg.  A midline incision was   made followed by median parapatellar arthrotomy.  Following initial   exposure, attention was first directed  to the patella.  Precut   measurement was noted to be 24 mm.  I resected down to 14 mm and used a   38 anatomic patellar button to restore patellar height as well as cover the cut surface.      The lug holes were drilled and a metal shim was placed to protect the   patella from retractors and saw blade during the procedure.      At this point, attention was now directed to the femur.  The femoral   canal was opened with a drill, irrigated to try to prevent fat emboli.  An   intramedullary rod was passed at 5 degrees valgus, 9 mm of bone was   resected off the distal femur.  Following this resection, the tibia was   subluxated anteriorly.  Using the extramedullary guide, 2 mm of bone was resected off   the proximal medial tibia.  We confirmed the gap would be   stable medially and laterally with a size 5 spacer block as well as confirmed that the tibial cut was perpendicular in the coronal plane, checking with an alignment rod.      Once this was done, I sized the femur to be a size 6 in the anterior-   posterior dimension, chose a  narrow component based on medial and   lateral dimension.  The size 6 rotation block was then pinned in   position anterior referenced using the C-clamp to set rotation.  The   anterior, posterior, and  chamfer cuts were made without difficulty nor   notching making certain that I was along the anterior cortex to help   with flexion gap stability.      The final box cut was made off the lateral aspect of distal femur.      At this point, the tibia was sized to be a size 6.  The size 6 tray was   then pinned in position through the medial third of the tubercle,   drilled, and keel punched.  Trial reduction was now carried with a 6 femur,  6 tibia, a size 7 mm PS insert, and the 38 anatomic patella botton.  The knee was brought to full extension with good flexion stability with the patella   tracking through the trochlea without application of pressure.  Given   all these findings the trial components removed.  Final components were   opened and cement was mixed.  The knee was irrigated with normal saline solution and pulse lavage.  The synovial lining was   then injected with 30 cc of 0.25% Marcaine with epinephrine, 1 cc of Toradol and 30 cc of NS for a total of 61 cc.     Final implants were then cemented onto cleaned and dried cut surfaces of bone with the knee brought to extension with a size 7 mm PS trial insert.      Once the cement had fully cured, excess cement was removed   throughout the knee.  I confirmed that I was satisfied with the range of   motion and stability, and the final size 7 mm PS AOX insert was chosen.  It was   placed into the knee.      The tourniquet had been let down at 35 minutes.  No significant   hemostasis was required.  The extensor mechanism was then reapproximated using #1 Vicryl and #1 Stratafix sutures with the knee   in flexion.  The   remaining  wound was closed with 2-0 Vicryl and running 4-0 Monocryl.   The knee was cleaned, dried, dressed  sterilely using Dermabond and   Aquacel dressing.  The patient was then   brought to recovery room in stable condition, tolerating the procedure   well.   Please note that Physician Assistant, Rosalene Billings, PA-C was present for the entirety of the case, and was utilized for pre-operative positioning, peri-operative retractor management, general facilitation of the procedure and for primary wound closure at the end of the case.              Madlyn Frankel Charlann Boxer, M.D.    01/12/2021 7:14 AM

## 2021-01-12 NOTE — Anesthesia Procedure Notes (Signed)
Spinal  Patient location during procedure: OR End time: 01/12/2021 7:13 AM Reason for block: surgical anesthesia Staffing Performed: resident/CRNA  Anesthesiologist: Brennan Bailey, MD Resident/CRNA: Maxwell Caul, CRNA Preanesthetic Checklist Completed: patient identified, IV checked, site marked, risks and benefits discussed, surgical consent, monitors and equipment checked, pre-op evaluation and timeout performed Spinal Block Patient position: sitting Prep: DuraPrep Patient monitoring: heart rate, cardiac monitor, continuous pulse ox and blood pressure Approach: midline Location: L3-4 Injection technique: single-shot Needle Needle type: Pencan  Needle gauge: 24 G Needle length: 10 cm Assessment Sensory level: T4 Events: CSF return Additional Notes IV functioning, monitors applied to pt. Expiration date of kit checked and confirmed to be in date. Sterile prep and drape, hand hygiene and sterile gloves used. Pt was positioned and spine was prepped in sterile fashion. Skin was anesthetized with lidocaine. Free flow of clear CSF obtained prior to injecting local anesthetic into CSF x 1 attempt. Spinal needle aspirated freely following injection. Needle was carefully withdrawn, and pt tolerated procedure well. Loss of motor and sensory on exam post injection. Dr Daiva Huge at bedside during entire placement.

## 2021-01-12 NOTE — Discharge Instructions (Signed)

## 2021-01-12 NOTE — Evaluation (Signed)
Physical Therapy Evaluation Patient Details Name: Jacob [name] MRN: [id number] DOB: 1965/07/02 Today's Date: 01/12/2021  History of Present Illness  Patient is 55 y.o. male s/p Lt TKA on 01/12/21 with PMH significant for anxiety, CAD, HLD, HTN, RA, MI, Rt TKA in 2017.   Clinical Impression  Jacob [name] is a 55 y.o. male POD 0 s/p Lt TKA. Patient reports independence with mobility at baseline. Patient is now limited by functional impairments (see PT problem list below) and requires min guard/supervision for transfers and gait with RW. Patient was able to ambulate ~130 feet with RW and min guard/supervision and cues for safe walker management. Patient educated on safe sequencing for stair mobility and verbalized safe guarding position for people assisting with mobility. Patient instructed in exercises to facilitate ROM and circulation. Patient will benefit from continued skilled PT interventions to address impairments and progress towards PLOF. Patient has met mobility goals at adequate level for discharge home; will continue to follow if pt continues acute stay to progress towards Mod I goals.        Recommendations for follow up therapy are one component of a multi-disciplinary discharge planning process, led by the attending physician.  Recommendations may be updated based on patient status, additional functional criteria and insurance authorization.  Follow Up Recommendations Follow surgeon's recommendation for DC plan and follow-up therapies    Equipment Recommendations  Rolling walker with 5" wheels (pt needs from [place])    Recommendations for Other Services       Precautions / Restrictions Precautions Precautions: Fall Restrictions Weight Bearing Restrictions: No Other Position/Activity Restrictions: WBAT      Mobility  Bed Mobility Overal bed mobility: Needs Assistance Bed Mobility: Supine to Sit;Sit to Supine     Supine to sit: Supervision;HOB elevated Sit to supine:  Supervision;HOB elevated   General bed mobility comments: pt taking extra time, no assist.    Transfers Overall transfer level: Needs assistance Equipment used: Rolling walker (2 wheeled) Transfers: Sit to/from Stand Sit to Stand: Min guard;Supervision         General transfer comment: cues for technique/hand placement on RW, no overt LOB noted with rise. pt steady in standing.  Ambulation/Gait Ambulation/Gait assistance: Supervision;Min guard Gait Distance (Feet): 130 Feet Assistive device: Rolling walker (2 wheeled) Gait Pattern/deviations: Step-to pattern;Decreased stance time - left;Decreased weight shift to left;Decreased stride length Gait velocity: decr   General Gait Details: cues for step to pattern and proximity to RW, no overt LOB noted through out. slight buckling in stance phase but improved with practice and knee immobilizer donned for stairs.  Stairs Stairs: Yes Stairs assistance: Min guard Stair Management: One rail Right;Step to pattern;Forwards;With crutches Number of Stairs: 5 General stair comments: cues for safe sequenicng of stairs "up with good, down with bad" and position of crutch. knee immobilizer used for safety. pt also complete 1x curb negotiation of reverse step up with RW.  Wheelchair Mobility    Modified Rankin (Stroke Patients Only)       Balance Overall balance assessment: Needs assistance Sitting-balance support: Feet supported Sitting balance-Leahy Scale: Good     Standing balance support: During functional activity;Bilateral upper extremity supported Standing balance-Leahy Scale: Poor                               Pertinent Vitals/Pain Pain Assessment: 0-10 Pain Score: 7  Pain Location: Lt knee Pain Descriptors / Indicators: Aching;Discomfort Pain Intervention(s): Limited activity  within patient's tolerance;Monitored during session;Repositioned;Ice applied    Home Living Family/patient expects to be discharged  to:: Private residence Living Arrangements: Spouse/significant other;Children Available Help at Discharge: Family Type of Home: House Home Access: Stairs to enter Entrance Stairs-Rails: None Entrance Stairs-Number of Steps: 1 Home Layout: Two level;Bed/bath upstairs Home Equipment: Walker - 2 wheels;Cane - single point Additional Comments: pt's daughter, his wife, and grandchild are at home with him    Prior Function Level of Independence: Independent               Hand Dominance   Dominant Hand: Right    Extremity/Trunk Assessment   Upper Extremity Assessment Upper Extremity Assessment: Overall WFL for tasks assessed    Lower Extremity Assessment Lower Extremity Assessment: LLE deficits/detail LLE Deficits / Details: good quad activation, no extensor lag with SLR. slight buckling in stance phase but improved with practice and knee immobilizer donned for stairs. LLE Sensation: WNL LLE Coordination: WNL    Cervical / Trunk Assessment Cervical / Trunk Assessment: Normal  Communication   Communication: No difficulties  Cognition Arousal/Alertness: Awake/alert Behavior During Therapy: WFL for tasks assessed/performed Overall Cognitive Status: Within Functional Limits for tasks assessed                                        General Comments      Exercises Total Joint Exercises Ankle Circles/Pumps: AROM;Both;10 reps;Seated Quad Sets: AROM;Left;Other reps (comment);Seated Short Arc Quad: AROM;Left;Other reps (comment);Seated Heel Slides: AROM;Left;Other reps (comment);Seated Hip ABduction/ADduction: AROM;Left;Other reps (comment);Seated   Assessment/Plan    PT Assessment Patient needs continued PT services  PT Problem List Decreased strength;Decreased range of motion;Decreased activity tolerance;Decreased balance;Decreased mobility;Decreased knowledge of use of DME;Decreased knowledge of precautions;Obesity;Pain       PT Treatment  Interventions DME instruction;Gait training;Stair training;Functional mobility training;Therapeutic activities;Therapeutic exercise;Balance training;Patient/family education    PT Goals (Current goals can be found in the Care Plan section)  Acute Rehab PT Goals Patient 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, start new gunsmith business PT Goal Formulation: With patient Time For Goal Achievement: 01/19/21 Potential to Achieve Goals: Good    Frequency 7X/week   Barriers to discharge        Co-evaluation               AM-PAC PT "6 Clicks" Mobility  Outcome Measure Help needed turning from your back to your side while in a flat bed without using bedrails?: None Help needed moving from lying on your back to sitting on the side of a flat bed without using bedrails?: A Little Help needed moving to and from a bed to a chair (including a wheelchair)?: A Little Help needed standing up from a chair using your arms (e.g., wheelchair or bedside chair)?: A Little Help needed to walk in hospital room?: A Little Help needed climbing 3-5 steps with a railing? : A Little 6 Click Score: 19    End of Session Equipment Utilized During Treatment: Gait belt;Left knee immobilizer Activity Tolerance: Patient tolerated treatment well Patient left: in chair;with call bell/phone within reach Nurse Communication: Mobility status PT Visit Diagnosis: Muscle weakness (generalized) (M62.81);Difficulty in walking, not elsewhere classified (R26.2)    Time: 6226-3335 PT Time Calculation (min) (ACUTE ONLY): 53 min   Charges:   PT Evaluation $PT Eval Low Complexity: 1 Low PT Treatments $Gait Training: 23-37 mins $Therapeutic Exercise: 8-22 mins  Verner Mould, DPT Acute Rehabilitation Services Office [phone] Pager [phone]   Jacques Navy 01/12/2021, 5:28 PM

## 2021-01-12 NOTE — Interval H&P Note (Signed)
History and Physical Interval Note:  01/12/2021 6:51 AM  Jacob [name]  has presented today for surgery, with the diagnosis of Left knee osteoarthritis.  The various methods of treatment have been discussed with the patient and family. After consideration of risks, benefits and other options for treatment, the patient has consented to  Procedure(s): TOTAL KNEE ARTHROPLASTY (Left) as a surgical intervention.  The patient's history has been reviewed, patient examined, no change in status, stable for surgery.  I have reviewed the patient's chart and labs.  Questions were answered to the patient's satisfaction.     Shelda Pal

## 2021-01-12 NOTE — Anesthesia Procedure Notes (Signed)
Procedure Name: MAC Date/Time: 01/12/2021 7:13 AM Performed by: Maxwell Caul, CRNA Pre-anesthesia Checklist: Patient identified, Emergency Drugs available, Suction available and Patient being monitored Oxygen Delivery Method: Simple face mask

## 2021-01-12 NOTE — Anesthesia Procedure Notes (Signed)
Anesthesia Regional Block: Adductor canal block   Pre-Anesthetic Checklist: , timeout performed,  Correct Patient, Correct Site, Correct Laterality,  Correct Procedure, Correct Position, site marked,  Risks and benefits discussed,  Pre-op evaluation,  At surgeon's request and post-op pain management  Laterality: Left  Prep: Maximum Sterile Barrier Precautions used, chloraprep       Needles:  Injection technique: Single-shot  Needle Type: Echogenic Stimulator Needle     Needle Length: 9cm  Needle Gauge: 22     Additional Needles:   Procedures:,,,, ultrasound used (permanent image in chart),,    Narrative:  Start time: 01/12/2021 6:55 AM End time: 01/12/2021 6:58 AM Injection made incrementally with aspirations every 5 mL.  Performed by: Personally  Anesthesiologist: Kaylyn Layer, MD  Additional Notes: Risks, benefits, and alternative discussed. Patient gave consent for procedure. Patient prepped and draped in sterile fashion. Sedation administered, patient remains easily responsive to voice. Relevant anatomy identified with ultrasound guidance. Local anesthetic given in 5cc increments with no signs or symptoms of intravascular injection. No pain or paraesthesias with injection. Patient monitored throughout procedure with signs of LAST or immediate complications. Tolerated well. Ultrasound image placed in chart.  Amalia Greenhouse, MD

## 2021-01-13 ENCOUNTER — Encounter (HOSPITAL_COMMUNITY): Payer: Self-pay | Admitting: Orthopedic Surgery

## 2021-03-19 ENCOUNTER — Emergency Department (HOSPITAL_BASED_OUTPATIENT_CLINIC_OR_DEPARTMENT_OTHER): Payer: No Typology Code available for payment source | Admitting: Radiology

## 2021-03-19 ENCOUNTER — Inpatient Hospital Stay (HOSPITAL_COMMUNITY)
Admission: EM | Disposition: A | Discharge status: Home / Self Care | Payer: Self-pay | Source: Home / Self Care | Attending: Emergency Medicine

## 2021-03-19 ENCOUNTER — Other Ambulatory Visit: Payer: Self-pay

## 2021-03-19 ENCOUNTER — Inpatient Hospital Stay (HOSPITAL_BASED_OUTPATIENT_CLINIC_OR_DEPARTMENT_OTHER)
Admission: EM | Admit: 2021-03-19 | Discharge: 2021-03-20 | DRG: 251 | Disposition: A | Discharge status: Home / Self Care | Payer: No Typology Code available for payment source | Attending: Emergency Medicine | Admitting: Emergency Medicine

## 2021-03-19 ENCOUNTER — Emergency Department (HOSPITAL_BASED_OUTPATIENT_CLINIC_OR_DEPARTMENT_OTHER): Payer: No Typology Code available for payment source

## 2021-03-19 ENCOUNTER — Encounter (HOSPITAL_BASED_OUTPATIENT_CLINIC_OR_DEPARTMENT_OTHER): Payer: Self-pay | Admitting: Emergency Medicine

## 2021-03-19 ENCOUNTER — Inpatient Hospital Stay (HOSPITAL_COMMUNITY): Payer: No Typology Code available for payment source

## 2021-03-19 DIAGNOSIS — I214 Non-ST elevation (NSTEMI) myocardial infarction: Principal | ICD-10-CM | POA: Diagnosis present

## 2021-03-19 DIAGNOSIS — E782 Mixed hyperlipidemia: Secondary | ICD-10-CM | POA: Diagnosis present

## 2021-03-19 DIAGNOSIS — Z87891 Personal history of nicotine dependence: Secondary | ICD-10-CM | POA: Diagnosis not present

## 2021-03-19 DIAGNOSIS — F419 Anxiety disorder, unspecified: Secondary | ICD-10-CM | POA: Diagnosis present

## 2021-03-19 DIAGNOSIS — I2582 Chronic total occlusion of coronary artery: Secondary | ICD-10-CM | POA: Diagnosis present

## 2021-03-19 DIAGNOSIS — Z79899 Other long term (current) drug therapy: Secondary | ICD-10-CM

## 2021-03-19 DIAGNOSIS — K30 Functional dyspepsia: Secondary | ICD-10-CM | POA: Diagnosis present

## 2021-03-19 DIAGNOSIS — M069 Rheumatoid arthritis, unspecified: Secondary | ICD-10-CM | POA: Diagnosis present

## 2021-03-19 DIAGNOSIS — I252 Old myocardial infarction: Secondary | ICD-10-CM | POA: Diagnosis not present

## 2021-03-19 DIAGNOSIS — G4730 Sleep apnea, unspecified: Secondary | ICD-10-CM | POA: Diagnosis present

## 2021-03-19 DIAGNOSIS — I10 Essential (primary) hypertension: Secondary | ICD-10-CM | POA: Diagnosis present

## 2021-03-19 DIAGNOSIS — Z833 Family history of diabetes mellitus: Secondary | ICD-10-CM | POA: Diagnosis not present

## 2021-03-19 DIAGNOSIS — Z8249 Family history of ischemic heart disease and other diseases of the circulatory system: Secondary | ICD-10-CM

## 2021-03-19 DIAGNOSIS — Z20822 Contact with and (suspected) exposure to covid-19: Secondary | ICD-10-CM | POA: Diagnosis present

## 2021-03-19 DIAGNOSIS — T82855A Stenosis of coronary artery stent, initial encounter: Secondary | ICD-10-CM | POA: Diagnosis present

## 2021-03-19 DIAGNOSIS — E119 Type 2 diabetes mellitus without complications: Secondary | ICD-10-CM | POA: Diagnosis present

## 2021-03-19 DIAGNOSIS — E785 Hyperlipidemia, unspecified: Secondary | ICD-10-CM | POA: Diagnosis present

## 2021-03-19 DIAGNOSIS — I2510 Atherosclerotic heart disease of native coronary artery without angina pectoris: Secondary | ICD-10-CM | POA: Diagnosis present

## 2021-03-19 HISTORY — PX: LEFT HEART CATH AND CORONARY ANGIOGRAPHY: CATH118249

## 2021-03-19 HISTORY — PX: CORONARY BALLOON ANGIOPLASTY: CATH118233

## 2021-03-19 LAB — CREATININE, SERUM
Creatinine, Ser: 0.92 mg/dL (ref 0.61–1.24)
GFR, Estimated: >60 mL/min (ref >60)

## 2021-03-19 LAB — BASIC METABOLIC PANEL
Anion gap: 8 (ref 5–15)
BUN: 7 mg/dL (ref 6–20)
CO2: 26 mmol/L (ref 22–32)
Calcium: 9.1 mg/dL (ref 8.9–10.3)
Chloride: 99 mmol/L (ref 98–111)
Creatinine, Ser: 0.85 mg/dL (ref 0.61–1.24)
GFR, Estimated: >60 mL/min (ref >60)
Glucose, Bld: 138 mg/dL — ABNORMAL HIGH (ref 70–99)
Potassium: 3.4 mmol/L — ABNORMAL LOW (ref 3.5–5.1)
Sodium: 133 mmol/L — ABNORMAL LOW (ref 135–145)

## 2021-03-19 LAB — RESP PANEL BY RT-PCR (FLU A&B, COVID) ARPGX2
Influenza A by PCR: NEGATIVE
Influenza B by PCR: NEGATIVE
SARS Coronavirus 2 by RT PCR: NEGATIVE

## 2021-03-19 LAB — CBC
HCT: 47.2 % (ref 39.0–52.0)
HCT: 48 % (ref 39.0–52.0)
Hemoglobin: 14.9 g/dL (ref 13.0–17.0)
Hemoglobin: 15.1 g/dL (ref 13.0–17.0)
MCH: 27.5 pg (ref 26.0–34.0)
MCH: 27.7 pg (ref 26.0–34.0)
MCHC: 31.6 g/dL (ref 30.0–36.0)
MCHC: 31.5 g/dL (ref 30.0–36.0)
MCV: 87.2 fL (ref 80.0–100.0)
MCV: 87.9 fL (ref 80.0–100.0)
Platelets: 235 10*3/uL (ref 150–400)
Platelets: 270 10*3/uL (ref 150–400)
RBC: 5.41 MIL/uL (ref 4.22–5.81)
RBC: 5.46 MIL/uL (ref 4.22–5.81)
RDW: 16.9 % — ABNORMAL HIGH (ref 11.5–15.5)
RDW: 16.9 % — ABNORMAL HIGH (ref 11.5–15.5)
WBC: 7.6 10*3/uL (ref 4.0–10.5)
WBC: 9.2 10*3/uL (ref 4.0–10.5)
nRBC: 0 % (ref 0.0–0.2)
nRBC: 0 % (ref 0.0–0.2)

## 2021-03-19 LAB — TROPONIN I (HIGH SENSITIVITY)
Troponin I (High Sensitivity): 10 ng/L (ref <18)
Troponin I (High Sensitivity): 41 ng/L — ABNORMAL HIGH (ref <18)
Troponin I (High Sensitivity): 151 ng/L (ref <18)
Troponin I (High Sensitivity): 576 ng/L (ref <18)
Troponin I (High Sensitivity): 826 ng/L (ref <18)

## 2021-03-19 LAB — MAGNESIUM: Magnesium: 2 mg/dL (ref 1.7–2.4)

## 2021-03-19 LAB — D-DIMER, QUANTITATIVE: D-Dimer, Quant: 9.02 ug/mL-FEU — ABNORMAL HIGH (ref 0.00–0.50)

## 2021-03-19 LAB — POCT ACTIVATED CLOTTING TIME
Activated Clotting Time: 366 seconds
Activated Clotting Time: 582 seconds

## 2021-03-19 SURGERY — LEFT HEART CATH AND CORONARY ANGIOGRAPHY
Anesthesia: LOCAL

## 2021-03-19 MED ORDER — POTASSIUM CHLORIDE CRYS ER 20 MEQ PO TBCR
40.0000 meq | EXTENDED_RELEASE_TABLET | Freq: Once | ORAL | Status: AC
  Administered 2021-03-19: 12:00:00 40 meq via ORAL
  Filled 2021-03-19: qty 2

## 2021-03-19 MED ORDER — ASPIRIN 81 MG PO CHEW
324.0000 mg | CHEWABLE_TABLET | Freq: Once | ORAL | Status: AC
  Administered 2021-03-19: 09:00:00 324 mg via ORAL
  Filled 2021-03-19: qty 4

## 2021-03-19 MED ORDER — SODIUM CHLORIDE 0.9% FLUSH: 3.0000 mL | INTRAVENOUS | Status: DC | PRN

## 2021-03-19 MED ORDER — IOHEXOL 350 MG/ML SOLN
INTRAVENOUS | Status: DC | PRN
  Administered 2021-03-19: 16:00:00 175 mL

## 2021-03-19 MED ORDER — HEPARIN SODIUM (PORCINE) 1000 UNIT/ML IJ SOLN
INTRAMUSCULAR | Status: AC
  Filled 2021-03-19: qty 10

## 2021-03-19 MED ORDER — HEPARIN (PORCINE) IN NACL 1000-0.9 UT/500ML-% IV SOLN
INTRAVENOUS | Status: AC
  Filled 2021-03-19: qty 1000

## 2021-03-19 MED ORDER — METOPROLOL TARTRATE 12.5 MG HALF TABLET
12.5000 mg | ORAL_TABLET | Freq: Two times a day (BID) | ORAL | Status: DC
Start: 2021-03-19 — End: 2021-03-20
  Administered 2021-03-19 – 2021-03-20 (×2): 12.5 mg via ORAL
  Filled 2021-03-19 (×2): qty 1

## 2021-03-19 MED ORDER — FENTANYL CITRATE (PF) 100 MCG/2ML IJ SOLN
INTRAMUSCULAR | Status: AC
  Filled 2021-03-19: qty 2

## 2021-03-19 MED ORDER — ASPIRIN 81 MG PO CHEW
81.0000 mg | CHEWABLE_TABLET | Freq: Every day | ORAL | Status: DC
  Administered 2021-03-20: 09:00:00 81 mg via ORAL
  Filled 2021-03-19: qty 1

## 2021-03-19 MED ORDER — VERAPAMIL HCL 2.5 MG/ML IV SOLN
INTRAVENOUS | Status: DC | PRN
  Administered 2021-03-19: 16:00:00 10 mL via INTRA_ARTERIAL

## 2021-03-19 MED ORDER — SODIUM CHLORIDE 0.9 % IV SOLN: 250.0000 mL | INTRAVENOUS | Status: DC | PRN

## 2021-03-19 MED ORDER — CLOPIDOGREL BISULFATE 75 MG PO TABS
75.0000 mg | ORAL_TABLET | Freq: Every day | ORAL | Status: DC
  Administered 2021-03-20: 09:00:00 75 mg via ORAL
  Filled 2021-03-19: qty 1

## 2021-03-19 MED ORDER — HEPARIN SODIUM (PORCINE) 1000 UNIT/ML IJ SOLN
INTRAMUSCULAR | Status: DC | PRN
  Administered 2021-03-19 (×2): 5000 [IU] via INTRAVENOUS

## 2021-03-19 MED ORDER — ENOXAPARIN SODIUM 40 MG/0.4ML IJ SOSY
40.0000 mg | PREFILLED_SYRINGE | INTRAMUSCULAR | Status: DC
  Administered 2021-03-20: 09:00:00 40 mg via SUBCUTANEOUS
  Filled 2021-03-19: qty 0.4

## 2021-03-19 MED ORDER — HEPARIN (PORCINE) IN NACL 1000-0.9 UT/500ML-% IV SOLN
INTRAVENOUS | Status: DC | PRN
  Administered 2021-03-19 (×2): 500 mL

## 2021-03-19 MED ORDER — LABETALOL HCL 5 MG/ML IV SOLN: 10.0000 mg | INTRAVENOUS | Status: AC | PRN

## 2021-03-19 MED ORDER — SODIUM CHLORIDE 0.9% FLUSH
3.0000 mL | Freq: Two times a day (BID) | INTRAVENOUS | Status: DC
  Administered 2021-03-19 – 2021-03-20 (×2): 3 mL via INTRAVENOUS

## 2021-03-19 MED ORDER — SODIUM CHLORIDE 0.9 % IV SOLN: INTRAVENOUS | Status: AC

## 2021-03-19 MED ORDER — SODIUM CHLORIDE 0.9% FLUSH: 3.0000 mL | Freq: Two times a day (BID) | INTRAVENOUS | Status: DC

## 2021-03-19 MED ORDER — MIDAZOLAM HCL 2 MG/2ML IJ SOLN
INTRAMUSCULAR | Status: DC | PRN
  Administered 2021-03-19: 1 mg via INTRAVENOUS

## 2021-03-19 MED ORDER — HEPARIN (PORCINE) 25000 UT/250ML-% IV SOLN
1200.0000 [IU]/h | INTRAVENOUS | Status: DC
  Administered 2021-03-19: 11:00:00 1200 [IU]/h via INTRAVENOUS
  Filled 2021-03-19: qty 250

## 2021-03-19 MED ORDER — FENTANYL CITRATE (PF) 100 MCG/2ML IJ SOLN
INTRAMUSCULAR | Status: DC | PRN
  Administered 2021-03-19: 25 ug via INTRAVENOUS

## 2021-03-19 MED ORDER — ACETAMINOPHEN 325 MG PO TABS: 650.0000 mg | ORAL_TABLET | ORAL | Status: DC | PRN

## 2021-03-19 MED ORDER — NITROGLYCERIN 1 MG/10 ML FOR IR/CATH LAB
INTRA_ARTERIAL | Status: AC
  Filled 2021-03-19: qty 10

## 2021-03-19 MED ORDER — NITROGLYCERIN 0.4 MG SL SUBL
0.4000 mg | SUBLINGUAL_TABLET | SUBLINGUAL | Status: DC | PRN
  Administered 2021-03-19: 09:00:00 0.4 mg via SUBLINGUAL
  Filled 2021-03-19 (×2): qty 1

## 2021-03-19 MED ORDER — SODIUM CHLORIDE 0.9 % WEIGHT BASED INFUSION: 1.0000 mL/kg/h | INTRAVENOUS | Status: DC

## 2021-03-19 MED ORDER — LIDOCAINE HCL (PF) 1 % IJ SOLN
INTRAMUSCULAR | Status: AC
  Filled 2021-03-19: qty 30

## 2021-03-19 MED ORDER — CLOPIDOGREL BISULFATE 300 MG PO TABS
ORAL_TABLET | ORAL | Status: DC | PRN
  Administered 2021-03-19: 600 mg via ORAL

## 2021-03-19 MED ORDER — IOHEXOL 350 MG/ML SOLN
100.0000 mL | Freq: Once | INTRAVENOUS | Status: AC | PRN
  Administered 2021-03-19: 09:00:00 69 mL via INTRAVENOUS

## 2021-03-19 MED ORDER — ONDANSETRON HCL 4 MG/2ML IJ SOLN: 4.0000 mg | Freq: Four times a day (QID) | INTRAMUSCULAR | Status: DC | PRN

## 2021-03-19 MED ORDER — SODIUM CHLORIDE 0.9 % WEIGHT BASED INFUSION: 3.0000 mL/kg/h | INTRAVENOUS | Status: DC

## 2021-03-19 MED ORDER — CLOPIDOGREL BISULFATE 300 MG PO TABS
ORAL_TABLET | ORAL | Status: AC
  Filled 2021-03-19: qty 1

## 2021-03-19 MED ORDER — VERAPAMIL HCL 2.5 MG/ML IV SOLN
INTRAVENOUS | Status: AC
  Filled 2021-03-19: qty 2

## 2021-03-19 MED ORDER — MIDAZOLAM HCL 2 MG/2ML IJ SOLN
INTRAMUSCULAR | Status: AC
  Filled 2021-03-19: qty 2

## 2021-03-19 MED ORDER — LIDOCAINE HCL (PF) 1 % IJ SOLN
INTRAMUSCULAR | Status: DC | PRN
  Administered 2021-03-19: 2 mL

## 2021-03-19 MED ORDER — HEPARIN (PORCINE) 25000 UT/250ML-% IV SOLN: 10.0000 [IU]/kg/h | INTRAVENOUS | Status: DC

## 2021-03-19 MED ORDER — ATORVASTATIN CALCIUM 80 MG PO TABS
80.0000 mg | ORAL_TABLET | Freq: Every day | ORAL | Status: DC
  Administered 2021-03-19: 80 mg via ORAL
  Filled 2021-03-19: qty 1

## 2021-03-19 MED ORDER — NITROGLYCERIN 2 % TD OINT
0.5000 [in_us] | TOPICAL_OINTMENT | Freq: Once | TRANSDERMAL | Status: AC
  Administered 2021-03-19: 12:00:00 0.5 [in_us] via TOPICAL
  Filled 2021-03-19: qty 1

## 2021-03-19 MED ORDER — CLOPIDOGREL BISULFATE 75 MG PO TABS: 75.0000 mg | ORAL_TABLET | Freq: Every day | ORAL | Status: DC

## 2021-03-19 MED ORDER — HEPARIN BOLUS VIA INFUSION
4000.0000 [IU] | Freq: Once | INTRAVENOUS | Status: AC
  Administered 2021-03-19: 11:00:00 4000 [IU] via INTRAVENOUS

## 2021-03-19 MED ORDER — ASPIRIN 81 MG PO CHEW: 81.0000 mg | CHEWABLE_TABLET | ORAL | Status: DC

## 2021-03-19 MED ORDER — ATORVASTATIN CALCIUM 80 MG PO TABS: 80.0000 mg | ORAL_TABLET | Freq: Every day | ORAL | Status: DC

## 2021-03-19 MED ORDER — HYDRALAZINE HCL 20 MG/ML IJ SOLN: 10.0000 mg | INTRAMUSCULAR | Status: AC | PRN

## 2021-03-19 SURGICAL SUPPLY — 20 items
BALLN SAPPHIRE 2.0X12 (BALLOONS) ×3
BALLN SAPPHIRE 2.5X30 (BALLOONS) ×3
BALLOON SAPPHIRE 2.0X12 (BALLOONS) IMPLANT
BALLOON SAPPHIRE 2.5X30 (BALLOONS) IMPLANT
CATH INFINITI 5 FR JL3.5 (CATHETERS) ×2 IMPLANT
CATH INFINITI JR4 5F (CATHETERS) ×2 IMPLANT
CATH VISTA GUIDE 6FR XBLAD3.5 (CATHETERS) ×2 IMPLANT
CATH VISTA GUIDE 6FR XBLAD4 (CATHETERS) ×2 IMPLANT
DEVICE RAD COMP TR BAND LRG (VASCULAR PRODUCTS) ×2 IMPLANT
GLIDESHEATH SLEND A-KIT 6F 22G (SHEATH) ×2 IMPLANT
GUIDEWIRE INQWIRE 1.5J.035X260 (WIRE) IMPLANT
INQWIRE 1.5J .035X260CM (WIRE) ×3
KIT ENCORE 26 ADVANTAGE (KITS) ×2 IMPLANT
KIT ESSENTIALS PG (KITS) ×2 IMPLANT
KIT HEART LEFT (KITS) ×3 IMPLANT
PACK CARDIAC CATHETERIZATION (CUSTOM PROCEDURE TRAY) ×3 IMPLANT
SHEATH PROBE COVER 6X72 (BAG) ×2 IMPLANT
TRANSDUCER W/STOPCOCK (MISCELLANEOUS) ×3 IMPLANT
TUBING CIL FLEX 10 FLL-RA (TUBING) ×3 IMPLANT
WIRE ASAHI PROWATER 180CM (WIRE) ×2 IMPLANT

## 2021-03-19 NOTE — CV Procedure (Signed)
Totally occluded distal third of the LAD.  Patient is otherwise stable without chest pain.  This is likely the occlusion that led to symptoms.  The apex is dyskinetic. POBA was performed in overlapping fashion with a 2.0 x 12 mm balloon followed by a 2.5 mm x 30 balloon to 6 atm reconstituting the vessel and establishing TIMI grade III flow.  Did not seem prudent to place stents given the absence of ongoing symptoms.  Perhaps the LAD in his relatively young male will serve as a source of collaterals for the future. Diffusely diseased RCA and circumflex.  Patent LAD and right coronary stents. LVEDP 12 mmHg.  Inferoapical LAD is dyskinetic.  Distal anterior wall is severely hypokinetic.  RECOMMENDATIONS: Risk factor modification; aspirin and Plavix x1 year; aggressive lipid-lowering if the patient will allow

## 2021-03-19 NOTE — ED Triage Notes (Signed)
Presents for 7/10 central CP that started this AM at 2am. No radiation, squeezing pain. H/o MI with stents, has not taken any nitro or asa today. Nonsmoker. Denies SOB, diaphoresis, dizziness.

## 2021-03-19 NOTE — Progress Notes (Signed)
Pt is back on the floor from cath lab

## 2021-03-19 NOTE — Progress Notes (Signed)
ANTICOAGULATION CONSULT NOTE - Initial Consult  Pharmacy Consult for heparin Indication: chest pain/ACS  No Known Allergies  Patient Measurements: Weight: 108.9 kg (240 lb) Heparin Dosing Weight: 93kg  Vital Signs: Temp: 97.9 F (36.6 C) (12/23 0514) Temp Source: Oral (12/23 0514) BP: 135/92 (12/23 1015) Pulse Rate: 88 (12/23 1015)  Labs: Recent Labs    03/19/21 0521 03/19/21 0712 03/19/21 0945  HGB 14.9  --   --   HCT 47.2  --   --   PLT 235  --   --   CREATININE 0.85  --   --   TROPONINIHS 10 41* 151*    Estimated Creatinine Clearance: 118.9 mL/min (by C-G formula based on SCr of 0.85 mg/dL).   Medical History: Past Medical History:  Diagnosis Date   Anxiety disorder    Coronary artery disease    a. s/p Inferior MI 2010/2012 with RCA stenting;  b.05/2013 Cath/PCI: LM nl LAD 16m, LCX nl, RCA 15m ISR (4.0x20 Promus Premier DES), 70d ISR (3.0x16 Promus Premier DES), EF 55%   Hyperlipidemia    Hypertension    Myocardial infarction ([facility]CC) 2015   Pre-diabetes    Rheumatoid arthritis (HCC)    Sleep apnea    C-Pap    Assessment: Jacob [name] presenting with CP, hx CAD, elevated troponin, he is not on anticoagulation PTA.  CBC wnl  Goal of Therapy:  Heparin level 0.3-0.7 units/ml Monitor platelets by anticoagulation protocol: Yes   Plan:  Heparin 4000 units IV x 1, and gtt at 1200 units/hr F/u 6 hour heparin level F/u cards eval and recs  Daylene Posey, PharmD Clinical Pharmacist ED Pharmacist Phone # [phone] 03/19/2021 10:27 AM

## 2021-03-19 NOTE — Plan of Care (Signed)
The patient understands that risks included but are not limited to stroke (1 in 1000), death (1 in 1000), kidney failure [usually temporary] (1 in 500), bleeding (1 in 200), allergic reaction [possibly serious] (1 in 200).    

## 2021-03-19 NOTE — Interval H&P Note (Signed)
Cath Lab Visit (complete for each Cath Lab visit)  Clinical Evaluation Leading to the Procedure:   ACS: Yes.    Non-ACS:    Anginal Classification: CCS III  Anti-ischemic medical therapy: Minimal Therapy (1 class of medications)  Non-Invasive Test Results: No non-invasive testing performed  Prior CABG: No previous CABG      History and Physical Interval Note:  03/19/2021 3:04 PM  Jacob [name]  has presented today for surgery, with the diagnosis of chest pain.  The various methods of treatment have been discussed with the patient and family. After consideration of risks, benefits and other options for treatment, the patient has consented to  Procedure(s): LEFT HEART CATH AND CORONARY ANGIOGRAPHY (N/A) as a surgical intervention.  The patient's history has been reviewed, patient examined, no change in status, stable for surgery.  I have reviewed the patient's chart and labs.  Questions were answered to the patient's satisfaction.     Lyn Records III

## 2021-03-19 NOTE — ED Provider Notes (Addendum)
[facility] DEPT Provider Note   CSN: [id number] Arrival date & time: 03/19/21  [id number]     History Chief Complaint  Patient presents with   Chest Pain    SPIRO Jacob [name] is a 55 y.o. male.  HPI  55 year old male with past medical history of CAD status post MI with stenting, HTN, HLD, RA presents the emergency department with midsternal chest discomfort.  Patient states he woke up at 2 AM to use the restroom and noticed midsternal chest discomfort.  He said there is no radiation to the back or abdomen.  Mild associated shortness of breath.  He said he felt like he had indigestion, was belching.  He had 1 episode of vomiting and felt slightly better.  He admits to taking new supplements that are herbal in the past week.  States that the discomfort has improved but not fully resolved.  Denies any swelling of his lower extremities.  History of surgery at the end of October with no complications.  Denies any fever or recent illness.  Past Medical History:  Diagnosis Date   Anxiety disorder    Coronary artery disease    a. 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   Unstable angina ([place]) 06/21/2013   NSTEMI (non-ST elevated myocardial infarction) ([place]) 06/21/2013   Hypertension    Anxiety disorder    Hyperlipidemia    Angina pectoris ([place]) 06/20/2013   Chest pain 06/20/2013   CAD (coronary artery disease) 06/20/2013   Intermediate coronary syndrome ([place]) 06/20/2013    Past Surgical History:  Procedure Laterality Date   CARDIAC CATHETERIZATION  2012   1 stent   JOINT REPLACEMENT Right  2017   LEFT HEART CATHETERIZATION WITH CORONARY ANGIOGRAM N/A 06/20/2013   2 stents   TOTAL KNEE ARTHROPLASTY Left 01/12/2021   Procedure: TOTAL KNEE ARTHROPLASTY;  Surgeon: Paralee Cancel, MD;  Location: [facility];  Service: Orthopedics;  Laterality: Left;  with block       Family History  Problem Relation Age of Onset   Diabetes Mother    Heart disease Father    Heart attack Father     Social History   Tobacco Use   Smoking status: Former    Packs/day: 0.75    Years: 20.00    Pack years: 15.00    Types: Cigarettes    Quit date: 06/27/2010    Years since quitting: 10.7   Smokeless tobacco: Never  Vaping Use   Vaping Use: Never used  Substance Use Topics   Alcohol use: Not Currently   Drug use: No    Home Medications Prior to Admission medications   Medication Sig Start Date End Date Taking? Authorizing Provider  docusate sodium (COLACE) 100 MG capsule Take 1 capsule (100 mg total) by mouth 2 (two) times daily. 01/12/21   Irving Copas, PA-C  hydrochlorothiazide (HYDRODIURIL) 25 MG tablet Take 25 mg by mouth daily.    [provider]  methocarbamol (ROBAXIN) 500 MG tablet Take 1 tablet (500 mg total) by mouth every 6 (six) hours as  needed for muscle spasms. 01/12/21   Irving Copas, PA-C  OVER THE COUNTER MEDICATION Take 1 tablet by mouth daily. Super joint support    [provider]  OVER THE COUNTER MEDICATION Take 1 tablet by mouth daily. Heal and soothe    [provider]  oxyCODONE (ROXICODONE) 5 MG immediate release tablet Take 1-2 tablets (5-10 mg total) by mouth every 6 (six) hours as needed for severe pain. Max of 6 tabs daily 01/12/21 01/12/22  Irving Copas, PA-C  rosuvastatin (CRESTOR) 40 MG tablet Take 40 mg by mouth daily.    [provider]    Allergies    Patient has no known allergies.  Review of Systems   Review of Systems  Constitutional:  Positive for fatigue. Negative for chills and fever.  HENT:   Negative for congestion.   Eyes:  Negative for visual disturbance.  Respiratory:  Negative for shortness of breath.   Cardiovascular:  Positive for chest pain. Negative for palpitations and leg swelling.  Gastrointestinal:  Negative for abdominal pain, diarrhea and vomiting.  Genitourinary:  Negative for dysuria.  Musculoskeletal:  Negative for back pain.  Skin:  Negative for rash.  Neurological:  Negative for headaches.   Physical Exam Updated Vital Signs BP 104/68    Pulse 96    Temp 97.9 F (36.6 C) (Oral)    Resp 17    Wt 108.9 kg    SpO2 96%    BMI 36.49 kg/m   Physical Exam Vitals and nursing note reviewed.  Constitutional:      General: He is not in acute distress.    Appearance: Normal appearance. He is not diaphoretic.  HENT:     Head: Normocephalic.     Mouth/Throat:     Mouth: Mucous membranes are moist.  Cardiovascular:     Rate and Rhythm: Normal rate.  Pulmonary:     Effort: Pulmonary effort is normal. No respiratory distress.  Abdominal:     Palpations: Abdomen is soft.     Tenderness: There is no abdominal tenderness.  Musculoskeletal:     Right lower leg: No edema.     Left lower leg: No edema.  Skin:    General: Skin is warm.  Neurological:     Mental Status: He is alert and oriented to person, place, and time. Mental status is at baseline.  Psychiatric:        Mood and Affect: Mood normal.    ED Results / Procedures / Treatments   Labs (all labs ordered are listed, but only abnormal results are displayed) Labs Reviewed  BASIC METABOLIC PANEL - Abnormal; Notable for the following components:      Result Value   Sodium 133 (*)    Potassium 3.4 (*)    Glucose, Bld 138 (*)    All other components within normal limits  CBC - Abnormal; Notable for the following components:   RDW 16.9 (*)    All other components within normal limits  D-DIMER, QUANTITATIVE - Abnormal; Notable for the following components:   D-Dimer, Quant 9.02 (*)    All other  components within normal limits  TROPONIN I (HIGH SENSITIVITY) - Abnormal; Notable for the following components:   Troponin I (High Sensitivity) 41 (*)    All other components within normal limits  MAGNESIUM  TROPONIN I (HIGH SENSITIVITY)    EKG EKG Interpretation  Date/Time:  Friday March 19 2021 05:11:41 EST Ventricular Rate:  104 PR Interval:  161 QRS  Duration: 89 QT Interval:  382 QTC Calculation: 503 R Axis:   80 Text Interpretation: Sinus tachycardia Prolonged QT interval Confirmed by Coralee Pesa [phone]) on 03/19/2021 7:21:04 AM  Radiology DG Chest 2 View  Result Date: 03/19/2021 CLINICAL DATA:  55 year old male with chest pain. EXAM: CHEST - 2 VIEW COMPARISON:  Portable chest 06/20/2013 and earlier. FINDINGS: PA and lateral views. Multilevel chronic lateral rib fractures, progressed on the right side from prior exams. Mildly lower lung volumes. Mediastinal contours remain normal. Visualized tracheal air column is within normal limits. No pneumothorax, pulmonary edema, pleural effusion or confluent pulmonary opacity. Negative visible bowel gas. No acute osseous abnormality identified. IMPRESSION: 1. Lower lung volumes.  No acute cardiopulmonary abnormality. 2. Chronic bilateral rib fractures. Electronically Signed   By: Odessa Fleming M.D.   On: 03/19/2021 06:47    Procedures .Critical Care Performed by: Rozelle Logan, DO Authorized by: Rozelle Logan, DO   Critical care provider statement:    Critical care time (minutes):  45   Critical care time was exclusive of:  Separately billable procedures and treating other patients   Critical care was necessary to treat or prevent imminent or life-threatening deterioration of the following conditions:  Cardiac failure   Critical care was time spent personally by me on the following activities:  Development of treatment plan with patient or surrogate, discussions with consultants, evaluation of patient's response to treatment,  examination of patient, ordering and review of laboratory studies, ordering and review of radiographic studies, ordering and performing treatments and interventions, pulse oximetry, re-evaluation of patient's condition and review of old charts   I assumed direction of critical care for this patient from another provider in my specialty: no     Care discussed with: admitting provider     Medications Ordered in ED Medications  aspirin chewable tablet 324 mg (has no administration in time range)  nitroGLYCERIN (NITROSTAT) SL tablet 0.4 mg (has no administration in time range)    ED Course  I have reviewed the triage vital signs and the nursing notes.  Pertinent labs & imaging results that were available during my care of the patient were reviewed by me and considered in my medical decision making (see chart for details).    MDM Rules/Calculators/A&P                          55 year old male presents emergency department with central chest pain that started around 2 AM.  Present on arrival.  EKG is for the patient.  He was tachycardic on arrival, complains of mild shortness of breath.  Initial blood work is reassuring, negative troponin however D-dimer is elevated over 9.  CT PE study is negative.  Second and third troponin have both up trended, max is 151.  After nitroglycerin patient is chest pain-free.  Spoke with on-call cardiologist Dr. Antoine Poche, patient will be admitted to the cardiology service, transfer to [facility] with plan for cath later today.  Heparin has been ordered, COVID test is pending.  Patients evaluation and results requires admission for further treatment and care. Patient agrees with admission plan, offers no new complaints and is stable/unchanged at time of admit.  Chest pain had returned.  Repeat EKG shows T wave inversions in V5 through V6, Nitropaste placed with resolution of chest pain.  Troponin continues to uptrend.  Cardiology notified of this change, [facility] has  accepted the patient he is in transfer to [facility].  Final Clinical Impression(s) / ED Diagnoses Final diagnoses:  None    Rx / DC Orders ED Discharge Orders     None        Lorelle Gibbs, DO 03/19/21 1117    Sharlisa Hollifield, Alvin Critchley, DO 03/19/21 1352

## 2021-03-19 NOTE — Progress Notes (Signed)
Date and time results received: 03/19/21 1431  Test: Trop Critical Value: 826   Name of Provider Notified: Dr. Carolan Clines  Orders Received? Or Actions Taken?:  No new orders. Pt is currently in the cath lab.

## 2021-03-19 NOTE — H&P (Signed)
Cardiology Admission History and Physical:   Patient ID: Jacob [name] MRN: [id number]; DOB: 12-21-1965   Admission date: 03/19/2021  PCP:  Lyn Records, MD   [facility] Providers Cardiologist:  [facility]  Chief Complaint:  NSTEMI  Patient Profile:   Jacob [name] is a 55 y.o. male with CAD, HLD, HTN, arthritis, DM, and regular alcohol use who is being seen 03/19/2021 for the evaluation of NSTEMI.  History of Present Illness:   [name] has a history of ischemic heart disease dating back to 2010. He has a history of inferior and anterior STEMIs in 2010 and 2012 with stent placement. He stopped taking all cardiac mediation and presented back in 2015 with NSTEMI treated with DES x 2 RCA. He was recommended for lifelong DAPT. He has since been lost to follow up. Appears that he has followed with Dr. Reita May for cardiology care with [facility] cardiology switched to the [facility]  He has a history of functional disability due to left knee arthritis and underwent left TKA 12/2020 with [facility]. Cardiac clearance at the [facility] in [place] per patient likely Dr. Christa See. He has not followed with a cardiologist. Notes that he used to see Dr. Katrinka Blazing but this was back in 2015. He stopped taking aspirin. He's been of DAPT for "awhile". He has no CHF symptoms.   He presented to [facility] with chest pain and troponin elevation. He states that for the last few days he's had persistent chest pain at rest. He also had significant indigestion. He tried to sleep it off but this did not resolve the pain. [facility] called [place] for transfer and story was consistent with likely acute coronary syndrome. He was transferred here for [facility].  He is HDS. Blood pressures are normal Sinus rhythm Hgb 14 Platelets 235 Creatinine 0.85   D-dimer elevated, CTA negative for PE Troponin 41-> 151-> 576. Treated with 324 mg ASA, nitro, and heparin gtt.  A1c 6.7%.    Pt describes chest pain concerning for angina.    Past Medical History:  Diagnosis Date   Anxiety disorder    Coronary artery disease    a. s/p Inferior MI 2010/2012 with RCA stenting;  b.05/2013 Cath/PCI: LM nl LAD 31m, LCX nl, RCA 33m ISR (4.0x20 Promus Premier DES), 70d ISR (3.0x16 Promus Premier DES), EF 55%   Hyperlipidemia    Hypertension    Myocardial infarction (HCC) 2015   Pre-diabetes    Rheumatoid arthritis (HCC)    Sleep apnea    C-Pap    Past Surgical History:  Procedure Laterality Date   CARDIAC CATHETERIZATION  2012   1 stent   JOINT REPLACEMENT Right 2017   LEFT HEART CATHETERIZATION WITH CORONARY ANGIOGRAM N/A 06/20/2013   2 stents   TOTAL KNEE ARTHROPLASTY Left 01/12/2021   Procedure: TOTAL KNEE ARTHROPLASTY;  Surgeon: Durene Romans, MD;  Location: [facility];  Service: Orthopedics;  Laterality: Left;  with block     Medications Prior to Admission: Prior to Admission medications   Medication Sig Start Date End Date Taking? Authorizing Provider  docusate sodium (COLACE) 100 MG capsule Take 1 capsule (100 mg total) by mouth 2 (two) times daily. 01/12/21   Cassandria Anger, PA-C  hydrochlorothiazide (HYDRODIURIL) 25 MG tablet Take 25 mg by mouth daily.    [provider]  methocarbamol (ROBAXIN) 500 MG tablet Take 1 tablet (500 mg total) by mouth every 6 (six) hours as needed for muscle spasms. 01/12/21  Rosalene Billings R, PA-C  OVER THE COUNTER MEDICATION Take 1 tablet by mouth daily. Super joint support    [provider]  OVER THE COUNTER MEDICATION Take 1 tablet by mouth daily. Heal and soothe    [provider]  oxyCODONE (ROXICODONE) 5 MG immediate release tablet Take 1-2 tablets (5-10 mg total) by mouth every 6 (six) hours as needed for severe pain. Max of 6 tabs daily 01/12/21 01/12/22  Cassandria Anger, PA-C  rosuvastatin (CRESTOR) 40 MG tablet Take 40 mg by mouth daily.    [provider]     Allergies:   No Known  Allergies  Social History:   Social History   Socioeconomic History   Marital status: Married    Spouse name: Not on file   Number of children: Not on file   Years of education: Not on file   Highest education level: Not on file  Occupational History   Not on file  Tobacco Use   Smoking status: Former    Packs/day: 0.75    Years: 20.00    Pack years: 15.00    Types: Cigarettes    Quit date: 06/27/2010    Years since quitting: 10.7   Smokeless tobacco: Never  Vaping Use   Vaping Use: Never used  Substance and Sexual Activity   Alcohol use: Not Currently   Drug use: No   Sexual activity: Yes  Other Topics Concern   Not on file  Social History Narrative   Not on file   Social Determinants of Health   Financial Resource Strain: Not on file  Food Insecurity: Not on file  Transportation Needs: Not on file  Physical Activity: Not on file  Stress: Not on file  Social Connections: Not on file  Intimate Partner Violence: Not on file    Family History:   The patient's family history includes Diabetes in his mother; Heart attack in his father; Heart disease in his father.    ROS:  Please see the history of present illness.  All other ROS reviewed and negative.     Physical Exam/Data:   Vitals:   03/19/21 1245 03/19/21 1300 03/19/21 1446 03/19/21 1447  BP: (!) 135/94 (!) 147/89 (!) 149/104   Pulse: 84 86  90  Resp: 16 (!) 22 17 17   Temp:   98.2 F (36.8 C)   TempSrc:   Oral   SpO2: 98% 97% 97% 97%  Weight:   107.5 kg    No intake or output data in the 24 hours ending 03/19/21 1501 Last 3 Weights 03/19/2021 03/19/2021 01/12/2021  Weight (lbs) 236 lb 15.9 oz 240 lb 250 lb  Weight (kg) 107.5 kg 108.863 kg 113.399 kg     Body mass index is 36.03 kg/m.  General:  Distress, diaphoretic HEENT: normal Neck: no JVD Vascular: No carotid bruits; Distal pulses 2+ bilaterally   Cardiac:  normal S1, S2; RRR; no murmur  Lungs:  clear to auscultation bilaterally, no  wheezing, rhonchi or rales  Abd: soft, nontender, no hepatomegaly  Ext: no LE edema Musculoskeletal:  No deformities, BUE and BLE strength normal and equal Skin: warm and dry  Neuro:  CNs 2-12 intact, no focal abnormalities noted Psych:  Normal affect    EKG:  The ECG that was done  was personally reviewed and demonstrates sinus rhythm HR 89, minimal TWI laterally  Relevant CV Studies:  Cath 2015 IMPRESSIONS:  1. Acute coronary syndrome with high-grade mid stenosis and moderate to severe  stenosis distally of the right coronary.   2. Successful implantation of DES in the mid right coronary reducing the stenosis from 90% to 0% and in the distal right coronary reducing a 70% eccentric stenosis to 0%.   3. Widely patent LAD stent and distal right coronary stents that were placed previously   4. Circumflex with luminal irregularities   5. Low normal left ventricular systolic function    Laboratory Data:  High Sensitivity Troponin:   Recent Labs  Lab 03/19/21 0521 03/19/21 0712 03/19/21 0945 03/19/21 1300  TROPONINIHS 10 41* 151* 576*      Chemistry Recent Labs  Lab 03/19/21 0521 03/19/21 0712  NA 133*  --   K 3.4*  --   CL 99  --   CO2 26  --   GLUCOSE 138*  --   BUN 7  --   CREATININE 0.85  --   CALCIUM 9.1  --   MG  --  2.0  GFRNONAA >60  --   ANIONGAP 8  --     No results for input(s): PROT, ALBUMIN, AST, ALT, ALKPHOS, BILITOT in the last 168 hours. Lipids No results for input(s): CHOL, TRIG, HDL, LABVLDL, LDLCALC, CHOLHDL in the last 168 hours. Hematology Recent Labs  Lab 03/19/21 0521  WBC 7.6  RBC 5.41  HGB 14.9  HCT 47.2  MCV 87.2  MCH 27.5  MCHC 31.6  RDW 16.9*  PLT 235   Thyroid No results for input(s): TSH, FREET4 in the last 168 hours. BNPNo results for input(s): BNP, PROBNP in the last 168 hours.  DDimer  Recent Labs  Lab 03/19/21 [id number]  DDIMER 9.02*     Radiology/Studies:  DG Chest 2 View  Result Date: 03/19/2021 CLINICAL  DATA:  55 year old male with chest pain. EXAM: CHEST - 2 VIEW COMPARISON:  Portable chest 06/20/2013 and earlier. FINDINGS: PA and lateral views. Multilevel chronic lateral rib fractures, progressed on the right side from prior exams. Mildly lower lung volumes. Mediastinal contours remain normal. Visualized tracheal air column is within normal limits. No pneumothorax, pulmonary edema, pleural effusion or confluent pulmonary opacity. Negative visible bowel gas. No acute osseous abnormality identified. IMPRESSION: 1. Lower lung volumes.  No acute cardiopulmonary abnormality. 2. Chronic bilateral rib fractures. Electronically Signed   By: Genevie Ann M.D.   On: 03/19/2021 06:47   CT Angio Chest PE W/Cm &/Or Wo Cm  Result Date: 03/19/2021 CLINICAL DATA:  Pulmonary embolism (PE) suspected, high prob Pulmonary embolism (PE) suspected, positive D-dimer; chest pain EXAM: CT ANGIOGRAPHY CHEST WITH CONTRAST TECHNIQUE: Multidetector CT imaging of the chest was performed using the standard protocol during bolus administration of intravenous contrast. Multiplanar CT image reconstructions and MIPs were obtained to evaluate the vascular anatomy. CONTRAST:  44mL OMNIPAQUE IOHEXOL 350 MG/ML SOLN COMPARISON:  None. FINDINGS: Cardiovascular: Satisfactory opacification of the pulmonary arteries to the segmental level. No evidence of pulmonary embolism. Normal heart size. No pericardial effusion. Coronary artery calcification. Thoracic aorta is normal in caliber with minimal calcified plaque. Mediastinum/Nodes: No enlarged nodes. Lungs/Pleura: Imaging during expiration. Mucous plugging at the lung bases. Mild bibasilar atelectasis. Scarring at the right lung apex. 4 mm somewhat nodular opacity of the lower lobe abutting the junction of major and minor fissures (series 6, image 62). Upper Abdomen: No acute abnormality. Musculoskeletal: No acute abnormality. Review of the MIP images confirms the above findings. IMPRESSION: No evidence of  acute pulmonary embolism. Mild bibasilar atelectasis and mucous plugging at the lung bases. 4 mm nodular  opacity right lower lobe. No follow-up needed if patient is low-risk. Non-contrast chest CT can be considered in 12 months if patient is high-risk. This recommendation follows the consensus statement: Guidelines for Management of Incidental Pulmonary Nodules Detected on CT Images: From the Fleischner Society 2017; Radiology 2017; 284:228-243. Coronary artery calcification. Electronically Signed   By: Macy Mis M.D.   On: 03/19/2021 09:27     Assessment and Plan:   NSTEMI:  C/f acute coronary syndrome. He's been off ASA, hasn't been taking. He was recommended lifelong DAPT. He stopped brilinta he said a while ago. He notes job change as a barrier. Has good right radial pulse. Normal renal function. No CHF symptoms. Can lie flat. - LHC today - NPO - heparin gtt - s/p asa load - atorvastatin 80 mg daily - nitro SL - start BB - ordered echocardiogram - may be able to get medication assistance at the [place], [place].    Hypertension- well controlled. Will re-assess after cath. - BB per above  Hyperlipidemia with LDL goal < 55 DM Will need an updated med list. If progression of disease, may need to consider PCSK9i.  Consider SGLT2i.    Prior alcohol use - reportedly stopped drinking prior to TKA - evaluate current use   Risk Assessment/Risk Scores:    TIMI Risk Score for Unstable Angina or Non-ST Elevation MI:   The patient's TIMI risk score is 5, which indicates a 26% risk of all cause mortality, new or recurrent myocardial infarction or need for urgent revascularization in the next 14 days.       Severity of Illness: The appropriate patient status for this patient is INPATIENT. Inpatient status is judged to be reasonable and necessary in order to provide the required intensity of service to ensure the patient's safety. The patient's presenting symptoms, physical exam  findings, and initial radiographic and laboratory data in the context of their chronic comorbidities is felt to place them at high risk for further clinical deterioration. Furthermore, it is not anticipated that the patient will be medically stable for discharge from the hospital within 2 midnights of admission.   * I certify that at the point of admission it is my clinical judgment that the patient will require inpatient hospital care spanning beyond 2 midnights from the point of admission due to high intensity of service, high risk for further deterioration and high frequency of surveillance required.*   For questions or updates, please contact [place] Please consult [email or web address] for contact info under     Signed, Janina Mayo, MD  03/19/2021 3:01 PM

## 2021-03-20 ENCOUNTER — Encounter (HOSPITAL_COMMUNITY): Payer: Self-pay | Admitting: Cardiology

## 2021-03-20 ENCOUNTER — Other Ambulatory Visit (HOSPITAL_COMMUNITY): Payer: No Typology Code available for payment source

## 2021-03-20 ENCOUNTER — Inpatient Hospital Stay (HOSPITAL_COMMUNITY): Payer: No Typology Code available for payment source

## 2021-03-20 DIAGNOSIS — I214 Non-ST elevation (NSTEMI) myocardial infarction: Secondary | ICD-10-CM

## 2021-03-20 LAB — BASIC METABOLIC PANEL
Anion gap: 9 (ref 5–15)
BUN: 5 mg/dL — ABNORMAL LOW (ref 6–20)
CO2: 23 mmol/L (ref 22–32)
Calcium: 8.5 mg/dL — ABNORMAL LOW (ref 8.9–10.3)
Chloride: 102 mmol/L (ref 98–111)
Creatinine, Ser: 0.89 mg/dL (ref 0.61–1.24)
GFR, Estimated: >60 mL/min (ref >60)
Glucose, Bld: 124 mg/dL — ABNORMAL HIGH (ref 70–99)
Potassium: 3.6 mmol/L (ref 3.5–5.1)
Sodium: 134 mmol/L — ABNORMAL LOW (ref 135–145)

## 2021-03-20 LAB — ECHOCARDIOGRAM COMPLETE
AR max vel: 3.24 cm2
AV Area VTI: 3.57 cm2
AV Area mean vel: 3.51 cm2
AV Mean grad: 3 mmHg
AV Peak grad: 5.3 mmHg
Ao pk vel: 1.15 m/s
Area-P 1/2: 2.41 cm2
MV VTI: 3.1 cm2
S' Lateral: 2.9 cm
Weight: 3791.91 oz

## 2021-03-20 LAB — CBC
HCT: 49.7 % (ref 39.0–52.0)
Hemoglobin: 16.2 g/dL (ref 13.0–17.0)
MCH: 28.1 pg (ref 26.0–34.0)
MCHC: 32.6 g/dL (ref 30.0–36.0)
MCV: 86.3 fL (ref 80.0–100.0)
Platelets: 154 10*3/uL (ref 150–400)
RBC: 5.76 MIL/uL (ref 4.22–5.81)
RDW: 16.9 % — ABNORMAL HIGH (ref 11.5–15.5)
WBC: 6 10*3/uL (ref 4.0–10.5)
nRBC: 0 % (ref 0.0–0.2)

## 2021-03-20 MED ORDER — METOPROLOL TARTRATE 25 MG PO TABS: 25.0000 mg | ORAL_TABLET | Freq: Two times a day (BID) | ORAL | Status: DC

## 2021-03-20 MED ORDER — NITROGLYCERIN 0.4 MG SL SUBL: 0.4000 mg | SUBLINGUAL_TABLET | SUBLINGUAL | 12 refills | Status: DC | PRN

## 2021-03-20 MED ORDER — ASPIRIN 81 MG PO CHEW: 81.0000 mg | CHEWABLE_TABLET | Freq: Every day | ORAL | Status: AC

## 2021-03-20 MED ORDER — CLOPIDOGREL BISULFATE 75 MG PO TABS: 75.0000 mg | ORAL_TABLET | Freq: Every day | ORAL | 11 refills | Status: DC

## 2021-03-20 MED ORDER — METOPROLOL TARTRATE 25 MG PO TABS: 25.0000 mg | ORAL_TABLET | Freq: Two times a day (BID) | ORAL | 6 refills | Status: DC

## 2021-03-20 NOTE — Discharge Summary (Addendum)
Discharge Summary    Patient ID: Jacob [name] MRN: [id number]; DOB: Dec 19, 1965  Admit date: 03/19/2021 Discharge date: 03/20/2021  PCP:  Lyn Records, MD   [facility] Providers Cardiologist:  Maisie Fus, MD        Discharge Diagnoses    Principal Problem:   NSTEMI (non-ST elevated myocardial infarction) [facility]) Active Problems:   CAD (coronary artery disease)   Hypertension   Hyperlipidemia    Diagnostic Studies/Procedures    CARDIAC CATH: 03/19/2021 Diffuse three-vessel CAD Patent left main Diffuse 30 to 40% in-stent restenosis in mid LAD stents.  First diagonal contains 80% ostial narrowing.  LAD in the distal third is totally occluded. The total occlusion in the LAD is a likely source of pain over the past 12 to 24 hours.  No ongoing pain at the time of procedure.  Balloon angioplasty was performed to reestablish flow but stenting was not performed due to past history of noncompliance with dual antiplatelet therapy. Diffuse circumflex disease without focal high-grade obstruction Severe diffuse disease throughout the right coronary with patent prior stents Apical dyskinesis.  Normal distal anterior wall function as well as inferior wall.  EF 50%.  LVEDP 12 mmHg.     RECOMMENDATIONS:   Aggressive risk factor modification. Encouraged dual antiplatelet therapy for a year. Diagnostic Dominance: Right Intervention     ECHO: 03/19/2021  1. Left ventricular ejection fraction, by estimation, is 55 to 60%. The  left ventricle has normal function. The left ventricle demonstrates  regional wall motion abnormalities (see scoring diagram/findings for  description). There is mild left ventricular  hypertrophy. Left ventricular diastolic parameters are indeterminate. No  LV mural thrombus evident.   2. Right ventricular systolic function is normal. The right ventricular  size is normal. Tricuspid regurgitation signal is inadequate for assessing  PA pressure.    3. The mitral valve is grossly normal. No evidence of mitral valve  regurgitation.   4. The aortic valve is tricuspid. Aortic valve regurgitation is not  visualized. No aortic stenosis is present. Aortic valve mean gradient  measures 3.0 mmHg.   5. The inferior vena cava is normal in size with greater than 50%  respiratory variability, suggesting right atrial pressure of 3 mmHg.   Comparison(s): No prior Echocardiogram.   _____________   History of Present Illness     Jacob [name] is a 55 y.o. male with  CAD, HLD, HTN, arthritis, DM, and regular alcohol use, was admitted 03/19/2021 for a NSTEMI.  Mr. Karnes has a history of ischemic heart disease dating back to 2010. He has a history of inferior and anterior STEMIs in 2010 and 2012 with stent placement. He stopped taking all cardiac mediation and presented back in 2015 with NSTEMI treated with DES x 2 RCA. He was recommended for lifelong DAPT. He has since been lost to follow up. Appears that he has followed with Dr. Reita May for cardiology care with [facility] cardiology switched to the [facility]   He has a history of functional disability due to left knee arthritis and underwent left TKA 12/2020 with [facility]. Cardiac clearance at the [facility] in [place] per patient likely Dr. Christa See. He has not followed with a cardiologist. Notes that he used to see Dr. Katrinka Blazing but this was back in 2015. He stopped taking aspirin. He's been of DAPT for "awhile". He has no CHF symptoms.   Hospital Course     Consultants: None  Cardiac enzymes were elevated, indicating a non-STEMI.  He was taken to the Cath Lab on 12/23, results are above.  He had diffuse but nonobstructive three-vessel disease with the distal third of the LAD occluded.  This was treated with angioplasty.  He tolerated the procedure well.  On 12/24, he had an echocardiogram.  It showed a normal EF and no LV mural thrombus was seen.  He was seen by cardiac rehab and ambulated  without chest pain or shortness of breath.  He is to follow-up with cardiac rehab as an outpatient.  He requested to follow-up with Dr. Carolan Clines, and not with the cardiologist at the [facility] in [place].  He will need a fasting lipid profile at follow-up.  He was on Crestor 40 mg daily prior to admission and he is to continue this. Initiation of SGLT2 inhibitor can be considered as an outpatient as well.  He had an elevated D-dimer on admission and chest CT to rule out PE was performed.  On that CT, he had a 4 mm nodule at the right lung apex.  Because of his history of tobacco use, he will need a follow-up CT without contrast in 1 year.  I spoke to the patient regarding this and advised him that either [place] or his PCP could arrange this.  He will make sure it happens.  No further inpatient work-up is indicated and he is considered stable for discharge, to follow-up as an outpatient.  Did the patient have an acute coronary syndrome (MI, NSTEMI, STEMI, etc) this admission?:  Yes                               AHA/ACC Clinical Performance & Quality Measures: Aspirin prescribed? - Yes ADP Receptor Inhibitor (Plavix/Clopidogrel, Brilinta/Ticagrelor or Effient/Prasugrel) prescribed (includes medically managed patients)? - Yes Beta Blocker prescribed? - Yes High Intensity Statin (Lipitor 40-80mg  or Crestor 20-40mg ) prescribed? - Yes EF assessed during THIS hospitalization? - Yes For EF <40%, was ACEI/ARB prescribed? - Not Applicable (EF >/= 40%) For EF <40%, Aldosterone Antagonist (Spironolactone or Eplerenone) prescribed? - Not Applicable (EF >/= 40%) Cardiac Rehab Phase II ordered (including medically managed patients)? - Yes    _____________  Discharge Vitals Blood pressure (!) 129/93, pulse 88, temperature 98.4 F (36.9 C), temperature source Oral, resp. rate 15, weight 107.5 kg, SpO2 97 %.  Filed Weights   03/19/21 0510 03/19/21 1446  Weight: 108.9 kg 107.5 kg    Labs & Radiologic  Studies    CBC Recent Labs    03/19/21 1712 03/20/21 0046  WBC 9.2 6.0  HGB 15.1 16.2  HCT 48.0 49.7  MCV 87.9 86.3  PLT 270 154   Basic Metabolic Panel Recent Labs    26/71/24 0521 03/19/21 0712 03/19/21 1712 03/20/21 0046  NA 133*  --   --  134*  K 3.4*  --   --  3.6  CL 99  --   --  102  CO2 26  --   --  23  GLUCOSE 138*  --   --  124*  BUN 7  --   --  5*  CREATININE 0.85  --  0.92 0.89  CALCIUM 9.1  --   --  8.5*  MG  --  2.0  --   --    Liver Function Tests No results for input(s): AST, ALT, ALKPHOS, BILITOT, PROT, ALBUMIN in the last 72 hours. No results for input(s): LIPASE, AMYLASE in the last 72 hours.  High Sensitivity Troponin:   Recent Labs  Lab 03/19/21 0521 03/19/21 0712 03/19/21 0945 03/19/21 1300 03/19/21 1431  TROPONINIHS 10 41* 151* 576* 826*    BNP Invalid input(s): POCBNP D-Dimer Recent Labs    03/19/21 0712  DDIMER 9.02*   Hemoglobin A1C No results for input(s): HGBA1C in the last 72 hours. Fasting Lipid Panel No results for input(s): CHOL, HDL, LDLCALC, TRIG, CHOLHDL, LDLDIRECT in the last 72 hours. Thyroid Function Tests No results for input(s): TSH, T4TOTAL, T3FREE, THYROIDAB in the last 72 hours.  Invalid input(s): FREET3 _____________  DG Chest 2 View  Result Date: 03/19/2021 CLINICAL DATA:  55 year old male with chest pain. EXAM: CHEST - 2 VIEW COMPARISON:  Portable chest 06/20/2013 and earlier. FINDINGS: PA and lateral views. Multilevel chronic lateral rib fractures, progressed on the right side from prior exams. Mildly lower lung volumes. Mediastinal contours remain normal. Visualized tracheal air column is within normal limits. No pneumothorax, pulmonary edema, pleural effusion or confluent pulmonary opacity. Negative visible bowel gas. No acute osseous abnormality identified. IMPRESSION: 1. Lower lung volumes.  No acute cardiopulmonary abnormality. 2. Chronic bilateral rib fractures. Electronically Signed   By: Odessa Fleming M.D.    On: 03/19/2021 06:47   CT Angio Chest PE W/Cm &/Or Wo Cm  Result Date: 03/19/2021 CLINICAL DATA:  Pulmonary embolism (PE) suspected, high prob Pulmonary embolism (PE) suspected, positive D-dimer; chest pain EXAM: CT ANGIOGRAPHY CHEST WITH CONTRAST TECHNIQUE: Multidetector CT imaging of the chest was performed using the standard protocol during bolus administration of intravenous contrast. Multiplanar CT image reconstructions and MIPs were obtained to evaluate the vascular anatomy. CONTRAST:  80mL OMNIPAQUE IOHEXOL 350 MG/ML SOLN COMPARISON:  None. FINDINGS: Cardiovascular: Satisfactory opacification of the pulmonary arteries to the segmental level. No evidence of pulmonary embolism. Normal heart size. No pericardial effusion. Coronary artery calcification. Thoracic aorta is normal in caliber with minimal calcified plaque. Mediastinum/Nodes: No enlarged nodes. Lungs/Pleura: Imaging during expiration. Mucous plugging at the lung bases. Mild bibasilar atelectasis. Scarring at the right lung apex. 4 mm somewhat nodular opacity of the lower lobe abutting the junction of major and minor fissures (series 6, image 62). Upper Abdomen: No acute abnormality. Musculoskeletal: No acute abnormality. Review of the MIP images confirms the above findings. IMPRESSION: No evidence of acute pulmonary embolism. Mild bibasilar atelectasis and mucous plugging at the lung bases. 4 mm nodular opacity right lower lobe. No follow-up needed if patient is low-risk. Non-contrast chest CT can be considered in 12 months if patient is high-risk. This recommendation follows the consensus statement: Guidelines for Management of Incidental Pulmonary Nodules Detected on CT Images: From the Fleischner Society 2017; Radiology 2017; 284:228-243. Coronary artery calcification. Electronically Signed   By: Guadlupe Spanish M.D.   On: 03/19/2021 09:27   CARDIAC CATHETERIZATION  Result Date: 03/19/2021 Diffuse three-vessel CAD Patent left main Diffuse  30 to 40% in-stent restenosis in mid LAD stents.  First diagonal contains 80% ostial narrowing.  LAD in the distal third is totally occluded. The total occlusion in the LAD is a likely source of pain over the past 12 to 24 hours.  No ongoing pain at the time of procedure.  Balloon angioplasty was performed to reestablish flow but stenting was not performed due to past history of noncompliance with dual antiplatelet therapy. Diffuse circumflex disease without focal high-grade obstruction Severe diffuse disease throughout the right coronary with patent prior stents Apical dyskinesis.  Normal distal anterior wall function as well as inferior wall.  EF 50%.  LVEDP 12 mmHg. RECOMMENDATIONS: Aggressive risk factor modification. Encouraged dual antiplatelet therapy for a year.   ECHOCARDIOGRAM COMPLETE  Result Date: 03/20/2021    ECHOCARDIOGRAM REPORT   Patient Name:   Jacob [name] Date of Exam: 03/20/2021 Medical Rec #:  [id number]      Height:       68.0 in Accession #:    [id number]     Weight:       237.0 lb Date of Birth:  1966/02/08      BSA:          2.197 m Patient Age:    55 years       BP:           129/93 mmHg Patient Gender: M              HR:           79 bpm. Exam Location:  Inpatient Procedure: 2D Echo, Cardiac Doppler and Color Doppler Indications:    Acute myocardial infarction  History:        Patient has no prior history of Echocardiogram examinations.                 CAD; Risk Factors:Dyslipidemia, Hypertension, Diabetes and Sleep                 Apnea.  Sonographer:    Ross Ludwig RDCS (AE) Referring Phys: [id number] Barry Dienes [facility] IMPRESSIONS  1. Left ventricular ejection fraction, by estimation, is 55 to 60%. The left ventricle has normal function. The left ventricle demonstrates regional wall motion abnormalities (see scoring diagram/findings for description). There is mild left ventricular  hypertrophy. Left ventricular diastolic parameters are indeterminate. No LV mural thrombus evident.  2. Right  ventricular systolic function is normal. The right ventricular size is normal. Tricuspid regurgitation signal is inadequate for assessing PA pressure.  3. The mitral valve is grossly normal. No evidence of mitral valve regurgitation.  4. The aortic valve is tricuspid. Aortic valve regurgitation is not visualized. No aortic stenosis is present. Aortic valve mean gradient measures 3.0 mmHg.  5. The inferior vena cava is normal in size with greater than 50% respiratory variability, suggesting right atrial pressure of 3 mmHg. Comparison(s): No prior Echocardiogram. FINDINGS  Left Ventricle: Left ventricular ejection fraction, by estimation, is 55 to 60%. The left ventricle has normal function. The left ventricle demonstrates regional wall motion abnormalities. The left ventricular internal cavity size was normal in size. There is mild left ventricular hypertrophy. Left ventricular diastolic parameters are indeterminate.  LV Wall Scoring: The apical inferior segment and apex are hypokinetic. The entire anterior wall, entire lateral wall, entire septum, and inferior wall are normal. Right Ventricle: The right ventricular size is normal. No increase in right ventricular wall thickness. Right ventricular systolic function is normal. Tricuspid regurgitation signal is inadequate for assessing PA pressure. Left Atrium: Left atrial size was normal in size. Right Atrium: Right atrial size was normal in size. Pericardium: There is no evidence of pericardial effusion. Presence of epicardial fat layer. Mitral Valve: The mitral valve is grossly normal. No evidence of mitral valve regurgitation. MV peak gradient, 1.9 mmHg. The mean mitral valve gradient is 1.0 mmHg. Tricuspid Valve: The tricuspid valve is grossly normal. Tricuspid valve regurgitation is trivial. Aortic Valve: The aortic valve is tricuspid. There is mild aortic valve annular calcification. Aortic valve regurgitation is not visualized. No aortic stenosis is present.  Aortic valve mean gradient measures 3.0 mmHg. Aortic valve peak  gradient measures 5.3 mmHg. Aortic valve area, by VTI measures 3.57 cm. Pulmonic Valve: The pulmonic valve was grossly normal. Pulmonic valve regurgitation is trivial. Aorta: The aortic root is normal in size and structure. Venous: The inferior vena cava is normal in size with greater than 50% respiratory variability, suggesting right atrial pressure of 3 mmHg. IAS/Shunts: No atrial level shunt detected by color flow Doppler.  LEFT VENTRICLE PLAX 2D LVIDd:         5.10 cm   Diastology LVIDs:         2.90 cm   LV e' medial:    5.55 cm/s LV PW:         1.20 cm   LV E/e' medial:  8.8 LV IVS:        1.10 cm   LV e' lateral:   10.10 cm/s LVOT diam:     2.20 cm   LV E/e' lateral: 4.8 LV SV:         65 LV SV Index:   30 LVOT Area:     3.80 cm  RIGHT VENTRICLE             IVC RV Basal diam:  3.20 cm     IVC diam: 1.50 cm RV S prime:     11.70 cm/s TAPSE (M-mode): 2.2 cm LEFT ATRIUM           Index        RIGHT ATRIUM           Index LA diam:      3.10 cm 1.41 cm/m   RA Area:     14.10 cm LA Vol (A4C): 31.9 ml 14.52 ml/m  RA Volume:   35.00 ml  15.93 ml/m  AORTIC VALVE AV Area (Vmax):    3.24 cm AV Area (Vmean):   3.51 cm AV Area (VTI):     3.57 cm AV Vmax:           115.00 cm/s AV Vmean:          73.700 cm/s AV VTI:            0.183 m AV Peak Grad:      5.3 mmHg AV Mean Grad:      3.0 mmHg LVOT Vmax:         98.00 cm/s LVOT Vmean:        68.100 cm/s LVOT VTI:          0.172 m LVOT/AV VTI ratio: 0.94  AORTA Ao Root diam: 3.50 cm Ao Asc diam:  3.20 cm MITRAL VALVE MV Area (PHT): 2.41 cm    SHUNTS MV Area VTI:   3.10 cm    Systemic VTI:  0.17 m MV Peak grad:  1.9 mmHg    Systemic Diam: 2.20 cm MV Mean grad:  1.0 mmHg MV Vmax:       0.69 m/s MV Vmean:      51.9 cm/s MV Decel Time: 315 msec MV E velocity: 48.90 cm/s MV A velocity: 58.50 cm/s MV E/A ratio:  0.84 Nona Dell MD Electronically signed by Nona Dell MD Signature Date/Time:  03/20/2021/1:26:39 PM    Final    Disposition   Pt is being discharged home today in good condition.  Follow-up Plans & Appointments     Follow-up Information     Maisie Fus, MD Follow up.   Specialty: Cardiology Why: The office will call Contact information: [address] [place] [address] [phone]  Discharge Instructions     Amb Referral to Cardiac Rehabilitation   Complete by: As directed    Diagnosis: NSTEMI   After initial evaluation and assessments completed: Virtual Based Care may be provided alone or in conjunction with Phase 2 Cardiac Rehab based on patient barriers.: Yes   Diet - low sodium heart healthy   Complete by: As directed    Increase activity slowly   Complete by: As directed        Discharge Medications   Allergies as of 03/20/2021       Reactions   Atorvastatin Other (See Comments)   Joint pain   Lisinopril Cough        Medication List     STOP taking these medications    hydrochlorothiazide 25 MG tablet Commonly known as: HYDRODIURIL       TAKE these medications    aspirin 81 MG chewable tablet Chew 1 tablet (81 mg total) by mouth daily. Start taking on: March 21, 2021   clopidogrel 75 MG tablet Commonly known as: PLAVIX Take 1 tablet (75 mg total) by mouth daily with breakfast. Start taking on: March 21, 2021   docusate sodium 100 MG capsule Commonly known as: Colace Take 1 capsule (100 mg total) by mouth 2 (two) times daily.   methocarbamol 500 MG tablet Commonly known as: Robaxin Take 1 tablet (500 mg total) by mouth every 6 (six) hours as needed for muscle spasms.   metoprolol tartrate 25 MG tablet Commonly known as: LOPRESSOR Take 1 tablet (25 mg total) by mouth 2 (two) times daily.   nitroGLYCERIN 0.4 MG SL tablet Commonly known as: NITROSTAT Place 1 tablet (0.4 mg total) under the tongue every 5 (five) minutes as needed for chest pain.   OVER THE COUNTER  MEDICATION Take 1 tablet by mouth daily. Super joint support   OVER THE COUNTER MEDICATION Take 1 tablet by mouth daily. Heal and soothe   oxyCODONE 5 MG immediate release tablet Commonly known as: Roxicodone Take 1-2 tablets (5-10 mg total) by mouth every 6 (six) hours as needed for severe pain. Max of 6 tabs daily   rosuvastatin 40 MG tablet Commonly known as: CRESTOR Take 40 mg by mouth daily.           Outstanding Labs/Studies   None  Duration of Discharge Encounter   Greater than 30 minutes including physician time.  Signed, Theodore Demark, PA-C 03/20/2021, 1:43 PM

## 2021-03-20 NOTE — Progress Notes (Signed)
°  Echocardiogram 2D Echocardiogram has been performed.  Gerda Diss 03/20/2021, 1:11 PM

## 2021-03-20 NOTE — Plan of Care (Signed)

## 2021-03-20 NOTE — Progress Notes (Signed)
Progress Note  Patient Name: Jacob [name] Date of Encounter: 03/20/2021  Primary Cardiologist: Maisie Fus, MD  Subjective   No chest pain, tolerating ambulating with cardiac rehabilitation.  Would like to go home.  Inpatient Medications    Scheduled Meds:  aspirin  81 mg Oral Daily   atorvastatin  80 mg Oral Daily   clopidogrel  75 mg Oral Q breakfast   enoxaparin (LOVENOX) injection  40 mg Subcutaneous Q24H   metoprolol tartrate  12.5 mg Oral BID   sodium chloride flush  3 mL Intravenous Q12H   Continuous Infusions:  sodium chloride 0 mL/hr at 03/20/21 0101   PRN Meds: sodium chloride, acetaminophen, nitroGLYCERIN, ondansetron (ZOFRAN) IV, sodium chloride flush   Vital Signs    Vitals:   03/20/21 0524 03/20/21 0700 03/20/21 0800 03/20/21 1100  BP:  117/78 128/82 (!) 129/93  Pulse:  82 87 88  Resp:  16 14 15   Temp:  98.3 F (36.8 C)  98.4 F (36.9 C)  TempSrc:  Oral  Oral  SpO2: 99% 98% 96% 97%  Weight:        Intake/Output Summary (Last 24 hours) at 03/20/2021 1222 Last data filed at 03/20/2021 1200 Gross per 24 hour  Intake 332.76 ml  Output 1570 ml  Net -1237.24 ml   Filed Weights   03/19/21 0510 03/19/21 1446  Weight: 108.9 kg 107.5 kg    Telemetry    Sinus rhythm.  Personally reviewed.  ECG    An ECG dated 03/20/2021 was personally reviewed today and demonstrated:  Sinus rhythm with old anterior infarct pattern and anterolateral T wave inversions.  Physical Exam   GEN: No acute distress.   Neck: No JVD. Cardiac: RRR, no murmur, rub, or gallop.  Respiratory: Nonlabored. Clear to auscultation bilaterally. GI: Soft, nontender, bowel sounds present. MS: No edema; No deformity. Neuro:  Nonfocal. Psych: Alert and oriented x 3. Normal affect.  Labs    Chemistry Recent Labs  Lab 03/19/21 0521 03/19/21 1712 03/20/21 0046  NA 133*  --  134*  K 3.4*  --  3.6  CL 99  --  102  CO2 26  --  23  GLUCOSE 138*  --  124*  BUN 7  --  5*   CREATININE 0.85 0.92 0.89  CALCIUM 9.1  --  8.5*  GFRNONAA >60 >60 >60  ANIONGAP 8  --  9     Hematology Recent Labs  Lab 03/19/21 0521 03/19/21 1712 03/20/21 0046  WBC 7.6 9.2 6.0  RBC 5.41 5.46 5.76  HGB 14.9 15.1 16.2  HCT 47.2 48.0 49.7  MCV 87.2 87.9 86.3  MCH 27.5 27.7 28.1  MCHC 31.6 31.5 32.6  RDW 16.9* 16.9* 16.9*  PLT 235 270 154    Cardiac Enzymes Recent Labs  Lab 03/19/21 0521 03/19/21 0712 03/19/21 0945 03/19/21 1300 03/19/21 1431  TROPONINIHS 10 41* 151* 576* 826*    DDimer Recent Labs  Lab 03/19/21 0712  DDIMER 9.02*     Radiology    DG Chest 2 View  Result Date: 03/19/2021 CLINICAL DATA:  55 year old male with chest pain. EXAM: CHEST - 2 VIEW COMPARISON:  Portable chest 06/20/2013 and earlier. FINDINGS: PA and lateral views. Multilevel chronic lateral rib fractures, progressed on the right side from prior exams. Mildly lower lung volumes. Mediastinal contours remain normal. Visualized tracheal air column is within normal limits. No pneumothorax, pulmonary edema, pleural effusion or confluent pulmonary opacity. Negative visible bowel gas. No acute osseous  abnormality identified. IMPRESSION: 1. Lower lung volumes.  No acute cardiopulmonary abnormality. 2. Chronic bilateral rib fractures. Electronically Signed   By: Genevie Ann M.D.   On: 03/19/2021 06:47   CT Angio Chest PE W/Cm &/Or Wo Cm  Result Date: 03/19/2021 CLINICAL DATA:  Pulmonary embolism (PE) suspected, high prob Pulmonary embolism (PE) suspected, positive D-dimer; chest pain EXAM: CT ANGIOGRAPHY CHEST WITH CONTRAST TECHNIQUE: Multidetector CT imaging of the chest was performed using the standard protocol during bolus administration of intravenous contrast. Multiplanar CT image reconstructions and MIPs were obtained to evaluate the vascular anatomy. CONTRAST:  54mL OMNIPAQUE IOHEXOL 350 MG/ML SOLN COMPARISON:  None. FINDINGS: Cardiovascular: Satisfactory opacification of the pulmonary arteries  to the segmental level. No evidence of pulmonary embolism. Normal heart size. No pericardial effusion. Coronary artery calcification. Thoracic aorta is normal in caliber with minimal calcified plaque. Mediastinum/Nodes: No enlarged nodes. Lungs/Pleura: Imaging during expiration. Mucous plugging at the lung bases. Mild bibasilar atelectasis. Scarring at the right lung apex. 4 mm somewhat nodular opacity of the lower lobe abutting the junction of major and minor fissures (series 6, image 62). Upper Abdomen: No acute abnormality. Musculoskeletal: No acute abnormality. Review of the MIP images confirms the above findings. IMPRESSION: No evidence of acute pulmonary embolism. Mild bibasilar atelectasis and mucous plugging at the lung bases. 4 mm nodular opacity right lower lobe. No follow-up needed if patient is low-risk. Non-contrast chest CT can be considered in 12 months if patient is high-risk. This recommendation follows the consensus statement: Guidelines for Management of Incidental Pulmonary Nodules Detected on CT Images: From the Fleischner Society 2017; Radiology 2017; 284:228-243. Coronary artery calcification. Electronically Signed   By: Macy Mis M.D.   On: 03/19/2021 09:27   CARDIAC CATHETERIZATION  Result Date: 03/19/2021 Diffuse three-vessel CAD Patent left main Diffuse 30 to 40% in-stent restenosis in mid LAD stents.  First diagonal contains 80% ostial narrowing.  LAD in the distal third is totally occluded. The total occlusion in the LAD is a likely source of pain over the past 12 to 24 hours.  No ongoing pain at the time of procedure.  Balloon angioplasty was performed to reestablish flow but stenting was not performed due to past history of noncompliance with dual antiplatelet therapy. Diffuse circumflex disease without focal high-grade obstruction Severe diffuse disease throughout the right coronary with patent prior stents Apical dyskinesis.  Normal distal anterior wall function as well as  inferior wall.  EF 50%.  LVEDP 12 mmHg. RECOMMENDATIONS: Aggressive risk factor modification. Encouraged dual antiplatelet therapy for a year.    Cardiac Studies   Echocardiogram pending  Assessment & Plan    1.  NSTEMI with high-sensitivity troponin I up to 826.  Currently chest pain after PCI yesterday.  2.  CAD with previous history of inferior and anterior STEMI's, ultimately status postplacement of DES x2 in the RCA as well as the LAD.  Now found to have occlusion of the distal third of the LAD as culprit lesion status post balloon angioplasty, stent sites patent with mild to moderate in-stent restenosis and plan for continued medical therapy including long-term DAPT.  LVEF estimated 50% on angiography with apical dyskinesis, echocardiogram pending.  3.  Mixed hyperlipidemia on Crestor.  4.  Essential hypertension, recent systolics 99991111.  Currently on Lopressor.  5.  Elevated random glucose levels with history of prediabetes.  Follow-up echocardiogram today to assess LVEF, exclude apical thrombus.  Otherwise, anticipate discharge home today.  Continue aspirin and Plavix, Lipitor, and Lopressor  grams twice daily.  He would like to establish follow-up with our practice going forward (previously saw Dr. Katrinka Blazing and has had some interval follow-up with the [facility] hospital system).  Can be established with Dr. Wyline Mood or APP in the next 7 to 10 days.  He will need to have a fasting lipid profile at follow-up.  Can also give consideration of initiation of SGLT2 inhibitor as an outpatient.  Cardiac rehab arranged as well.  Signed, Nona Dell, MD  03/20/2021, 12:22 PM

## 2021-03-20 NOTE — Progress Notes (Signed)
CARDIAC REHAB PHASE I   PRE:  Rate/Rhythm: Sinus 83  BP:  Supine: 131/87      SaO2: 98  MODE:  Ambulation: 400 ft   POST:  Rate/Rhythem: 88  BP:    Sitting: 129/93     SaO2: 97%  1040-1135 Jacob [name] ambulated independently in the hallway without complaints or shortness of breath. Tolerated well. Assisted back to bed with call bell within reach. Reviewed heart healthy diet, MI booklet, Use of sublingual nitroglycerin and when to call 911. Discussed exercise instructions, temp precautions with the patient. Jacob [name] says he is interested in participating in  phase 2 cardiac rehab in [place] will place referral.  Thayer Headings RN BSN

## 2021-03-20 NOTE — Discharge Instructions (Signed)

## 2021-03-23 MED FILL — Nitroglycerin IV Soln 100 MCG/ML in D5W: INTRA_ARTERIAL | Qty: 10 | Status: AC

## 2021-03-25 ENCOUNTER — Telehealth (HOSPITAL_COMMUNITY): Payer: Self-pay

## 2021-03-25 NOTE — Telephone Encounter (Signed)
Called patient to see if he is interested in the Cardiac Rehab Program. Patient expressed interest. Explained scheduling process and went over insurance, patient verbalized understanding. Will contact patient for scheduling once f/u has been completed.  °

## 2021-04-09 ENCOUNTER — Other Ambulatory Visit: Payer: Self-pay

## 2021-04-09 ENCOUNTER — Encounter: Payer: Self-pay | Admitting: Internal Medicine

## 2021-04-09 ENCOUNTER — Ambulatory Visit (INDEPENDENT_AMBULATORY_CARE_PROVIDER_SITE_OTHER): Payer: Self-pay | Admitting: Internal Medicine

## 2021-04-09 VITALS — BP 132/90 | HR 89 | Resp 20 | Ht 68.0 in | Wt 245.2 lb

## 2021-04-09 DIAGNOSIS — I214 Non-ST elevation (NSTEMI) myocardial infarction: Secondary | ICD-10-CM

## 2021-04-09 MED ORDER — METOPROLOL SUCCINATE ER 50 MG PO TB24: 75.0000 mg | ORAL_TABLET | Freq: Every day | ORAL | 5 refills | Status: DC

## 2021-04-09 NOTE — Patient Instructions (Signed)
Medication Instructions:   STOP: METOPROLOL TARTRATE  START: METOPROLOL SUCCINATE 75mg  DAILY   *If you need a refill on your cardiac medications before your next appointment, please call your pharmacy*  Follow-Up: At [facility], you and your health needs are our priority.  As part of our continuing mission to provide you with exceptional heart care, we have created designated Provider Care Teams.  These Care Teams include your primary Cardiologist (physician) and Advanced Practice Providers (APPs -  Physician Assistants and Nurse Practitioners) who all work together to provide you with the care you need, when you need it.  Your next appointment:   6 month(s)  The format for your next appointment:   In Person  Provider:   Maisie Fus, MD

## 2021-04-09 NOTE — Progress Notes (Signed)
Cardiology Office Note:    Date:  04/09/2021   ID:  Jacob [name], DOB 1966/01/06, MRN [id number]  PCP:  Lyn Records, MD   [facility] Providers Cardiologist:  Maisie Fus, MD     Referring MD: Lyn Records, MD   No chief complaint on file. Hospital FU  History of Present Illness:    Jacob [name] is a 56 y.o. male with CAD, HLD, HTN, arthritis, DM, and regular alcohol use who was  being seen 03/19/2021 for the evaluation of NSTEMI in the hospital.   Per HPI on admission Mr. Murch has a history of ischemic heart disease dating back to 2010. He has a history of inferior and anterior STEMIs in 2010 and 2012 with stent placement. He stopped taking all cardiac mediation and presented back in 2015 with NSTEMI treated with DES x 2 RCA. He was recommended for lifelong DAPT. He has since been lost to follow up. Appears that he has followed with Dr. Reita May for cardiology care with [facility] cardiology switched to the [facility] [place].    He has a history of functional disability due to left knee arthritis and underwent left TKA 12/2020 with [facility]. Cardiac clearance at the [facility] in [place] per patient?Dr. Christa See. He has not followed with a cardiologist. Notes that he used to see Dr. Katrinka Blazing but this was back in 2015. He stopped taking aspirin. He's been off DAPT for "awhile". He has no CHF symptoms.    He presented to [facility] with chest pain and troponin elevation. He states that for the last few days he's had persistent chest pain at rest. He also had significant indigestion. He tried to sleep it off but this did not resolve the pain. [facility] called [place] for transfer and story was consistent with likely acute coronary syndrome. He was transferred here for [facility].   He is HDS. Blood pressures are normal Sinus rhythm Hgb 14 Platelets 235 Creatinine 0.85     D-dimer elevated, CTA negative for PE Troponin 41-> 151-> 576. Treated with 324 mg ASA, nitro,  and heparin gtt.  A1c 6.7%.    Pt describes chest pain concerning for angina.   He underwent LHC 03/19/2021 which showed no significant lesions but diffuse three vessel disease. He was recommended for medical therapy. He has not had any chest pain. He has no consistent follow up with cardiology. He has an appointment at the [facility] in [place] with cardiology 05/18/2021. They can see the records. He was doing fine longterm until the day of admission. He felt some indigestion and then had persistent chest pressure. He denies CHF symptoms. He lives here in [place] no smoking. 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 He's not driving. He has time for physical activity in between.   Cardiology Studies:  LHC 03/19/2022 Diffuse three-vessel CAD Patent left main Diffuse 30 to 40% in-stent restenosis in mid LAD stents.  First diagonal contains 80% ostial narrowing.  LAD in the distal third is totally occluded. The total occlusion in the LAD is a likely source of pain over the past 12 to 24 hours.  No ongoing pain at the time of procedure.  Balloon angioplasty was performed to reestablish flow but stenting was not performed due to past history of noncompliance with dual antiplatelet therapy. Diffuse circumflex disease without focal high-grade obstruction Severe diffuse disease throughout the right coronary with patent prior stents Apical dyskinesis.  Normal distal anterior wall function as well as inferior wall.  EF 50%.  LVEDP 12 mmHg.    TTE 03/19/2021  1. Left ventricular ejection fraction, by estimation, is 55 to 60%. The  left ventricle has normal function. The left ventricle demonstrates  regional wall motion abnormalities (see scoring diagram/findings for  description). There is mild left ventricular   hypertrophy. Left ventricular diastolic parameters are indeterminate. No  LV mural thrombus evident.   2. Right ventricular systolic function is normal.  The right ventricular  size is normal. Tricuspid regurgitation signal is inadequate for assessing  PA pressure.   3. The mitral valve is grossly normal. No evidence of mitral valve  regurgitation.   4. The aortic valve is tricuspid. Aortic valve regurgitation is not  visualized. No aortic stenosis is present. Aortic valve mean gradient  measures 3.0 mmHg.   5. The inferior vena cava is normal in size with greater than 50%  respiratory variability, suggesting right atrial pressure of 3 mmHg.   Past Medical History:  Diagnosis Date   Anxiety disorder    Coronary artery disease    a. s/p Inferior MI 2010/2012 with RCA stenting;  b.05/2013 Cath/PCI: LM nl LAD 44m, LCX nl, RCA 32m ISR (4.0x20 Promus Premier DES), 70d ISR (3.0x16 Promus Premier DES), EF 55%   Hyperlipidemia    Hypertension    Myocardial infarction (HCC) 2015   Pre-diabetes    Rheumatoid arthritis (HCC)    Sleep apnea    C-Pap    Past Surgical History:  Procedure Laterality Date   CARDIAC CATHETERIZATION  2012   1 stent   CORONARY BALLOON ANGIOPLASTY N/A 03/19/2021   Procedure: CORONARY BALLOON ANGIOPLASTY;  Surgeon: Lyn Records, MD;  Location: [facility] INVASIVE CV LAB;  Service: Cardiovascular;  Laterality: N/A;   JOINT REPLACEMENT Right 2017   LEFT HEART CATH AND CORONARY ANGIOGRAPHY N/A 03/19/2021   Procedure: LEFT HEART CATH AND CORONARY ANGIOGRAPHY;  Surgeon: Lyn Records, MD;  Location: [facility] INVASIVE CV LAB;  Service: Cardiovascular;  Laterality: N/A;   LEFT HEART CATHETERIZATION WITH CORONARY ANGIOGRAM N/A 06/20/2013   2 stents   TOTAL KNEE ARTHROPLASTY Left 01/12/2021   Procedure: TOTAL KNEE ARTHROPLASTY;  Surgeon: Durene Romans, MD;  Location: [facility] ORS;  Service: Orthopedics;  Laterality: Left;  with block    Current Medications: Current Meds  Medication Sig   aspirin 81 MG chewable tablet Chew 1 tablet (81 mg total) by mouth daily.   clopidogrel (PLAVIX) 75 MG tablet Take 1 tablet (75 mg total) by mouth daily  with breakfast.   docusate sodium (COLACE) 100 MG capsule Take 1 capsule (100 mg total) by mouth 2 (two) times daily.   metoprolol succinate (TOPROL-XL) 50 MG 24 hr tablet Take 1.5 tablets (75 mg total) by mouth daily. Take with or immediately following a meal.   nitroGLYCERIN (NITROSTAT) 0.4 MG SL tablet Place 1 tablet (0.4 mg total) under the tongue every 5 (five) minutes as needed for chest pain.   OVER THE COUNTER MEDICATION Take 1 tablet by mouth daily. Heal and soothe   OVER THE COUNTER MEDICATION Taking sea moss capsule daily   rosuvastatin (CRESTOR) 40 MG tablet Take 40 mg by mouth daily.   [DISCONTINUED] metoprolol tartrate (LOPRESSOR) 25 MG tablet Take 1 tablet (25 mg total) by mouth 2 (two) times daily.     Allergies:   Atorvastatin and Lisinopril   Social History   Socioeconomic History   Marital status: Married    Spouse  name: Not on file   Number of children: Not on file   Years of education: Not on file   Highest education level: Not on file  Occupational History   Not on file  Tobacco Use   Smoking status: Former    Packs/day: 0.75    Years: 20.00    Pack years: 15.00    Types: Cigarettes    Quit date: 06/27/2010    Years since quitting: 10.7   Smokeless tobacco: Never  Vaping Use   Vaping Use: Never used  Substance and Sexual Activity   Alcohol use: Not Currently   Drug use: No   Sexual activity: Yes  Other Topics Concern   Not on file  Social History Narrative   Not on file   Social Determinants of Health   Financial Resource Strain: Not on file  Food Insecurity: Not on file  Transportation Needs: Not on file  Physical Activity: Not on file  Stress: Not on file  Social Connections: Not on file     Family History: The patient's family history includes Diabetes in his mother; Heart attack in his father; Heart disease in his father.  ROS:   Please see the history of present illness.     All other systems reviewed and are  negative.  EKGs/Labs/Other Studies Reviewed:    The following studies were reviewed today:   EKG:  EKG is  ordered today.  The ekg ordered today demonstrates  NSR, poor anterior R wave progression  Recent Labs: 01/01/2021: ALT 27 03/19/2021: Magnesium 2.0 03/20/2021: BUN 5; Creatinine, Ser 0.89; Hemoglobin 16.2; Platelets 154; Potassium 3.6; Sodium 134  Recent Lipid Panel    Component Value Date/Time   CHOL 190 07/30/2013 1023   TRIG 240.0 (H) 07/30/2013 1023   HDL 29.70 (L) 07/30/2013 1023   CHOLHDL 6 07/30/2013 1023   VLDL 48.0 (H) 07/30/2013 1023   LDLCALC 112 (H) 07/30/2013 1023     Risk Assessment/Calculations:           Physical Exam:    VS:  BP 132/90 (BP Location: Left Arm, Patient Position: Sitting, Cuff Size: Normal)    Pulse 89    Resp 20    Ht  (1.727 m)    Wt 245 lb 3.2 oz (111.2 kg)    SpO2 96%    BMI 37.28 kg/m     Wt Readings from Last 3 Encounters:  04/09/21 245 lb 3.2 oz (111.2 kg)  03/19/21 236 lb 15.9 oz (107.5 kg)  01/12/21 250 lb (113.4 kg)     GEN:  Well nourished, well developed in no acute distress HEENT: Normal NECK: No JVD; No carotid bruits LYMPHATICS: No lymphadenopathy CARDIAC: RRR, no murmurs, rubs, gallops RESPIRATORY:  Clear to auscultation without rales, wheezing or rhonchi  ABDOMEN: Soft, non-tender, non-distended MUSCULOSKELETAL:  No edema; No deformity  SKIN: Warm and dry NEUROLOGIC:  Alert and oriented x 3 PSYCHIATRIC:  Normal affect   ASSESSMENT:    #Ischemic CM: He has hx of ischemic CM with prior mid LAD PCI. CTO in the distal third of the LAD. This was ballooned but with past cessation of dapt on his own, placing DES was felt to be a risk for instent thrombosus. He has 80% D1. He has diffuse Lcx disease. Diffuse RCA disease with prior patent PCI. His wedge was not significantly elevated. Will plan to increase his BB therapy today. Will be important to keep his A1c at goal. Can consider SGLT2. With multivessel dx  &  DM2, if he has recurrence will discuss candidacy for CABG. Otherwise he has normal LV function and no valve dx. -increased to metop 75 mg XL daily -if more chest pain will add imdur -continue asa and plavix as long as tolerated -nitro SL PRN -continue crestor 40 mg daily  (LDL goal < 70 mg/dL; will address on next visit)   PLAN:    In order of problems listed above:  Stop Metop 25 mg BID tartrate Start Metop XL 75 mg daily [place] [facility] [place] [facility] [place] Follow up back here in 6 months    Cardiac Rehabilitation Eligibility Assessment            Medication Adjustments/Labs and Tests Ordered: Current medicines are reviewed at length with the patient today.  Concerns regarding medicines are outlined above.  Orders Placed This Encounter  Procedures   EKG 12-Lead   Meds ordered this encounter  Medications   metoprolol succinate (TOPROL-XL) 50 MG 24 hr tablet    Sig: Take 1.5 tablets (75 mg total) by mouth daily. Take with or immediately following a meal.    Dispense:  45 tablet    Refill:  5    Patient Instructions  Medication Instructions:   STOP: METOPROLOL TARTRATE  START: METOPROLOL SUCCINATE 75mg  DAILY   *If you need a refill on your cardiac medications before your next appointment, please call your pharmacy*  Follow-Up: At [facility], you and your health needs are our priority.  As part of our continuing mission to provide you with exceptional heart care, we have created designated Provider Care Teams.  These Care Teams include your primary Cardiologist (physician) and Advanced Practice Providers (APPs -  Physician Assistants and Nurse Practitioners) who all work together to provide you with the care you need, when you need it.  Your next appointment:   6 month(s)  The format for your next appointment:   In Person  Provider:   Maisie FusBranch, Haliyah Fryman E, MD      Signed, Maisie FusBranch, Rivers Hamrick E, MD  04/09/2021 5:25 PM    [facility]

## 2021-04-22 ENCOUNTER — Other Ambulatory Visit: Payer: Self-pay

## 2021-04-22 MED ORDER — ROSUVASTATIN CALCIUM 40 MG PO TABS: 40.0000 mg | ORAL_TABLET | Freq: Every day | ORAL | 1 refills | Status: AC

## 2021-04-22 MED ORDER — METOPROLOL SUCCINATE ER 50 MG PO TB24: 75.0000 mg | ORAL_TABLET | Freq: Every day | ORAL | 1 refills | Status: AC

## 2021-04-22 MED ORDER — NITROGLYCERIN 0.4 MG SL SUBL
0.4000 mg | SUBLINGUAL_TABLET | SUBLINGUAL | 6 refills | Status: AC | PRN
Start: 2021-04-22 — End: ?

## 2021-04-22 MED ORDER — CLOPIDOGREL BISULFATE 75 MG PO TABS: 75.0000 mg | ORAL_TABLET | Freq: Every day | ORAL | 6 refills | Status: AC

## 2021-04-22 NOTE — Telephone Encounter (Signed)
Pt walked into clinic and needs refills [facility] as requested

## 2021-04-28 ENCOUNTER — Telehealth (HOSPITAL_COMMUNITY): Payer: Self-pay

## 2021-04-28 NOTE — Telephone Encounter (Signed)
I reached out to pt to see [facility] know to [facility] for cardiac rehab. Pt stated that he has tried to call them but havent been able to reach anybody and to give him a few more days to reach out and that he will call us back. Pt states he uses the [facility] [place]. Placed pt ppw in the [place] folder.

## 2022-09-11 IMAGING — DX DG CHEST 2V
2 series · 2 of 2 positions shown · non-contrast
Comparison: Portable chest 06/20/2013 and earlier.

CLINICAL DATA: 55-year-old male with chest pain.

EXAM:
CHEST - 2 VIEW

[chest pa]
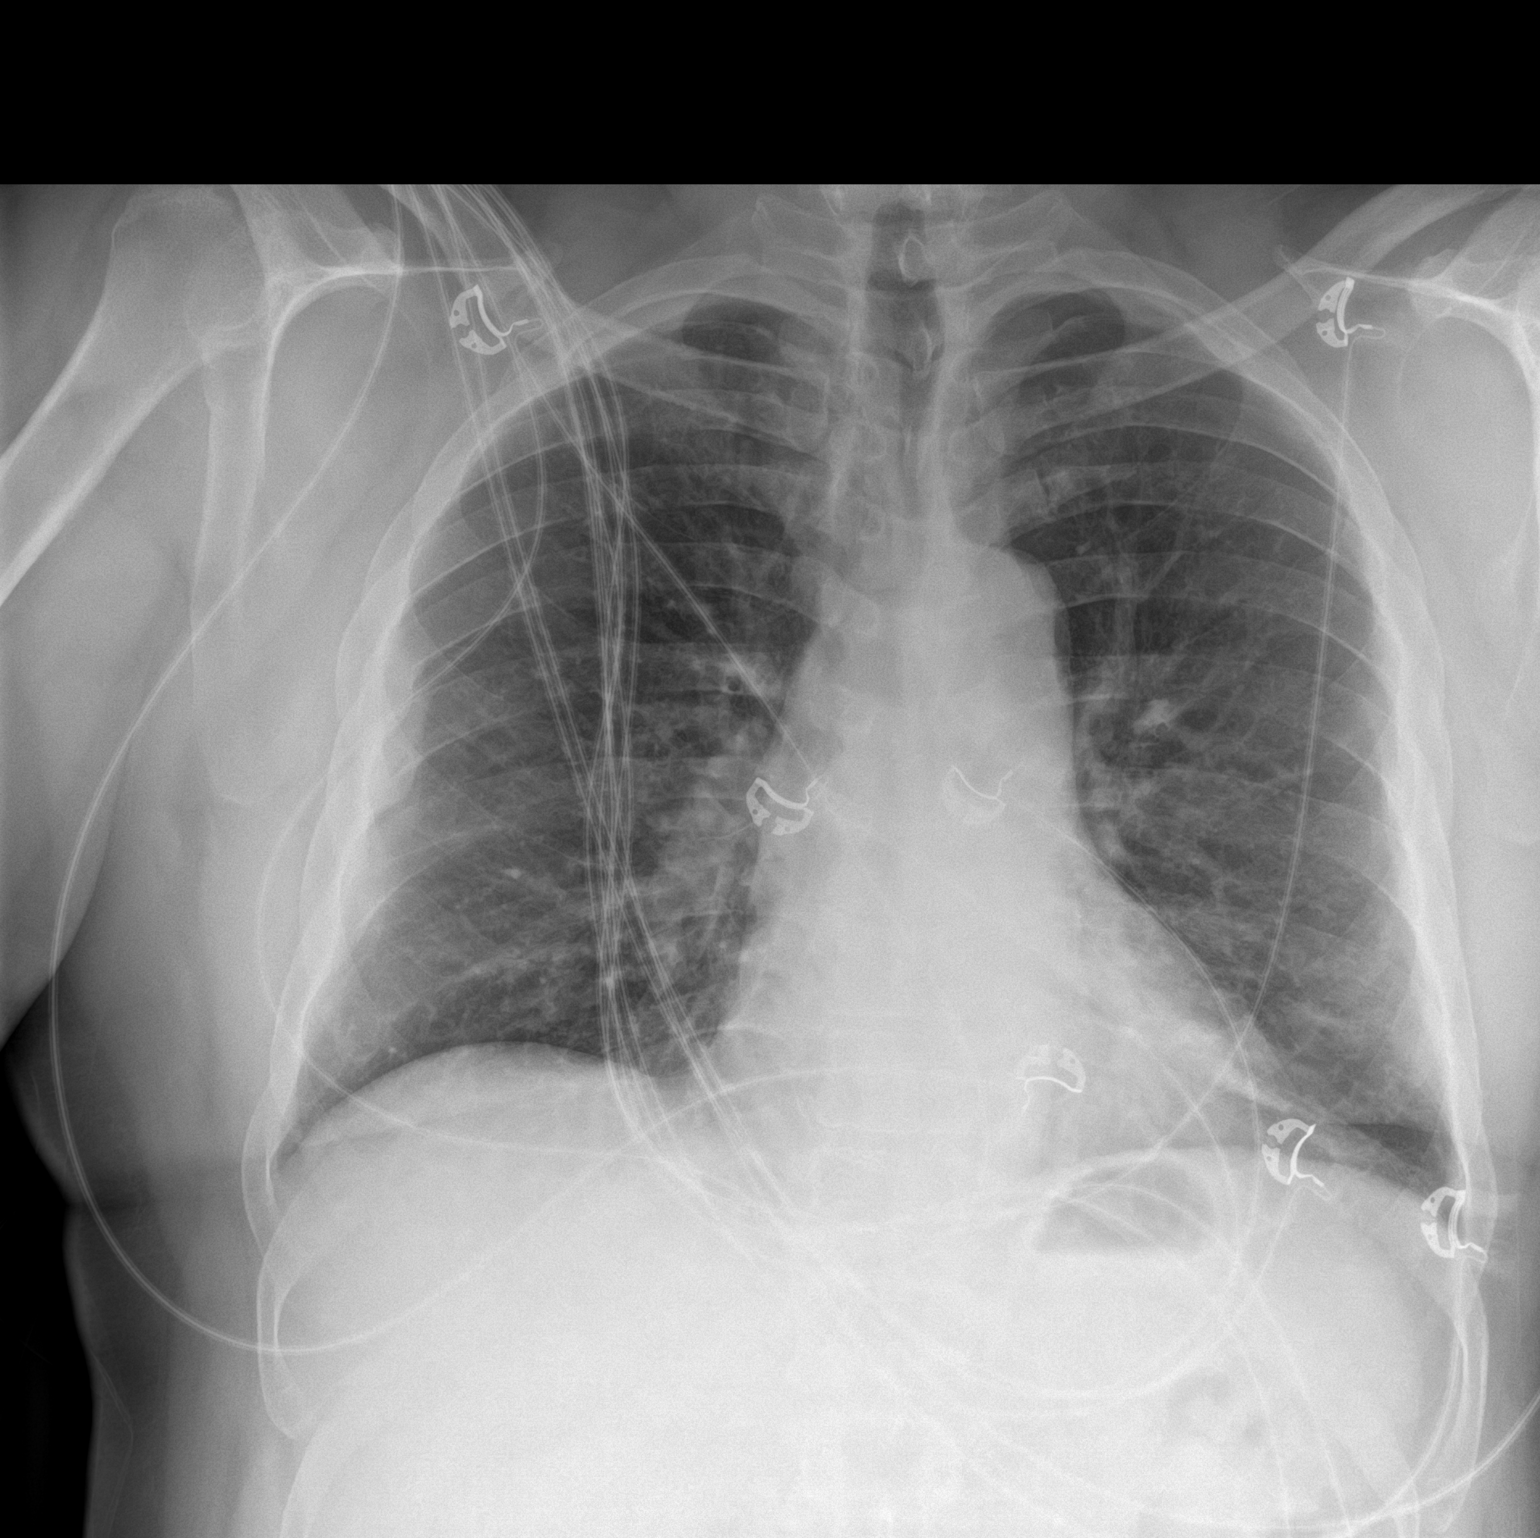

[chest lat]
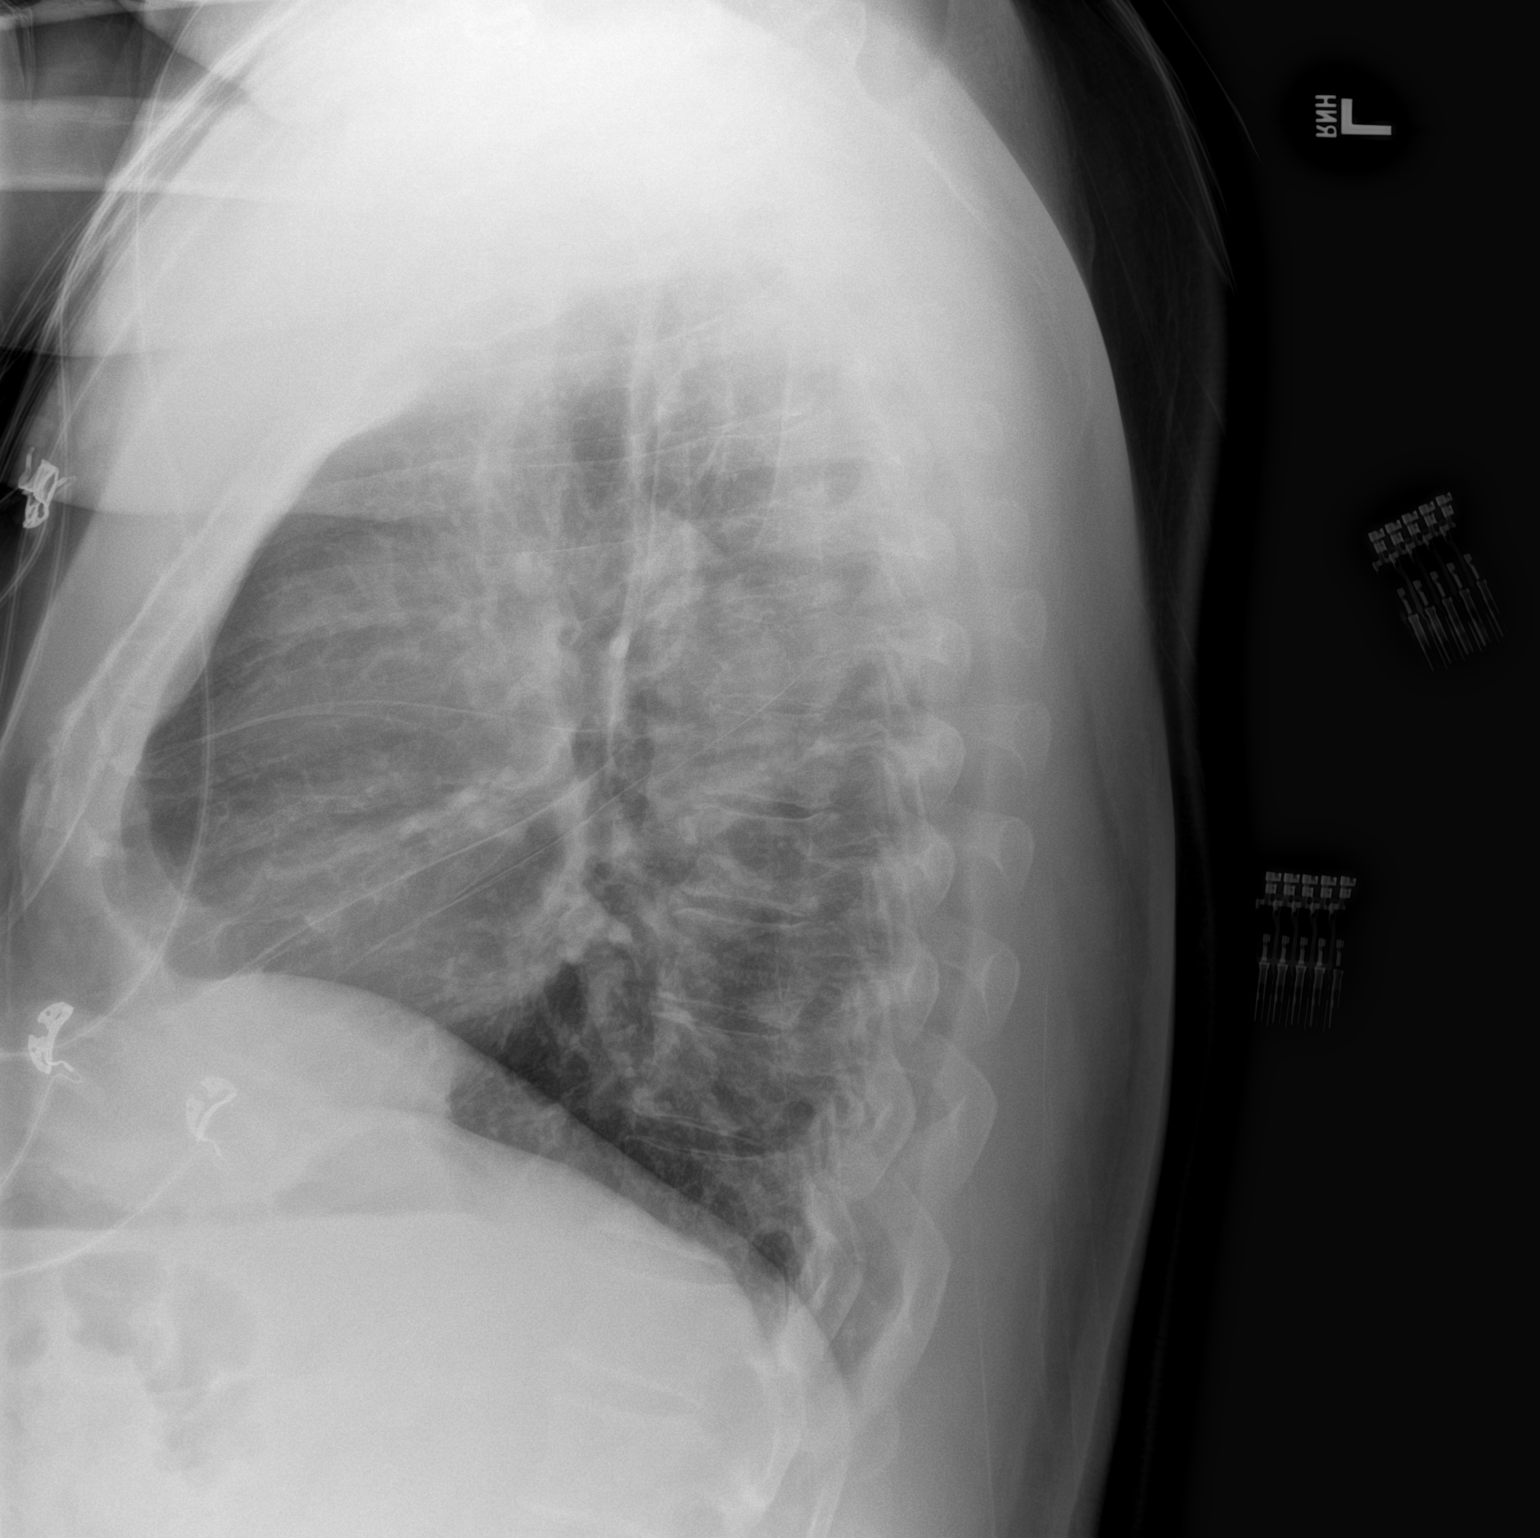

[2 of 2 positions shown; findings below may reference images not displayed]

FINDINGS: PA and lateral views. Multilevel chronic lateral rib fractures,
progressed on the right side from prior exams. Mildly lower lung
volumes. Mediastinal contours remain normal. Visualized tracheal air
column is within normal limits. No pneumothorax, pulmonary edema,
pleural effusion or confluent pulmonary opacity.

Negative visible bowel gas. No acute osseous abnormality identified.
IMPRESSION: 1. Lower lung volumes.  No acute cardiopulmonary abnormality.
2. Chronic bilateral rib fractures.

## 2022-09-11 IMAGING — CT CT ANGIO CHEST
2 of 7 series · 18 of 46 positions shown · IV contrast (omnipaque)
Comparison: None.

CLINICAL DATA: Pulmonary embolism (PE) suspected, high prob
Pulmonary embolism (PE) suspected, positive D-dimer; chest pain

EXAM:
CT ANGIOGRAPHY CHEST WITH CONTRAST
TECHNIQUE: Multidetector CT imaging of the chest was performed using the
standard protocol during bolus administration of intravenous
contrast. Multiplanar CT image reconstructions and MIPs were
obtained to evaluate the vascular anatomy.
CONTRAST:  69mL OMNIPAQUE IOHEXOL 350 MG/ML SOLN

[Series 5: pe axial thins · axial · 0.82mm/px · z∈[+1214,+1480]mm · 15 of 306 slices shown]
[im 20/306  lung]
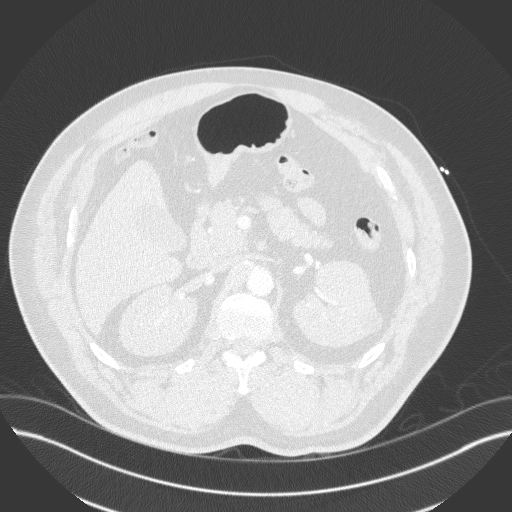
[im 39/306  soft-tissue]
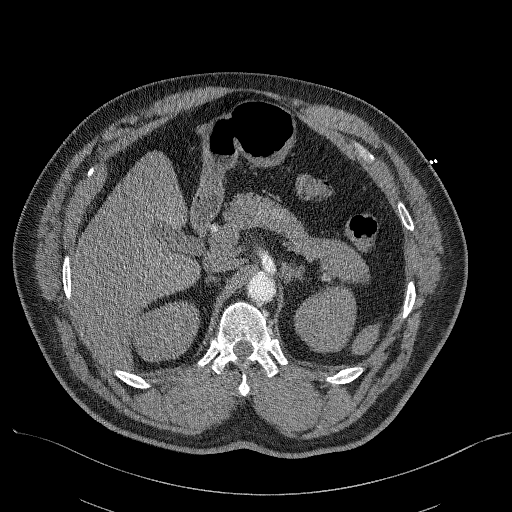
[im 58/306  lung]
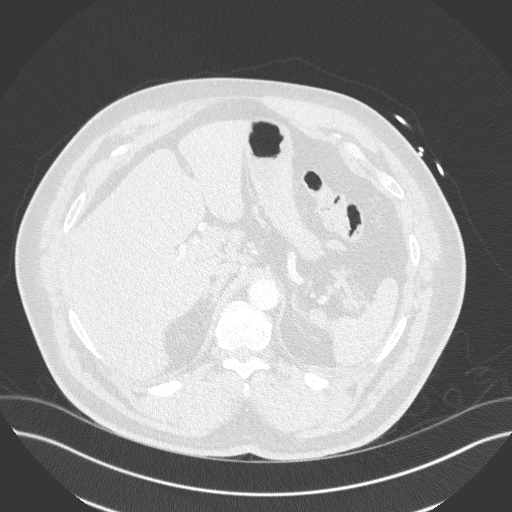
[im 77/306  soft-tissue]
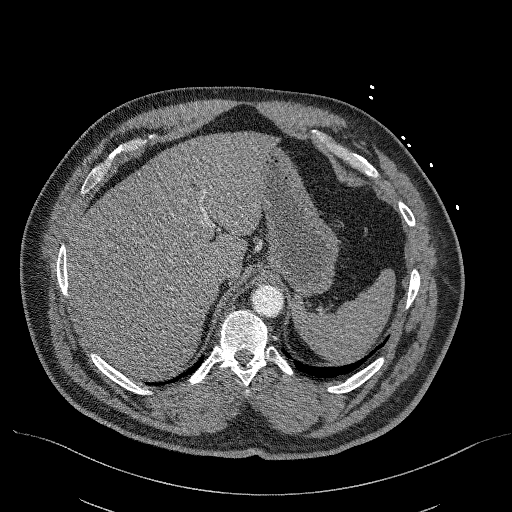
[im 96/306  lung]
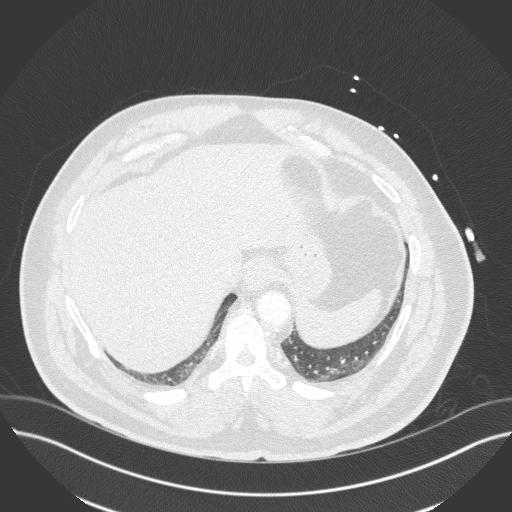
[im 115/306  soft-tissue]
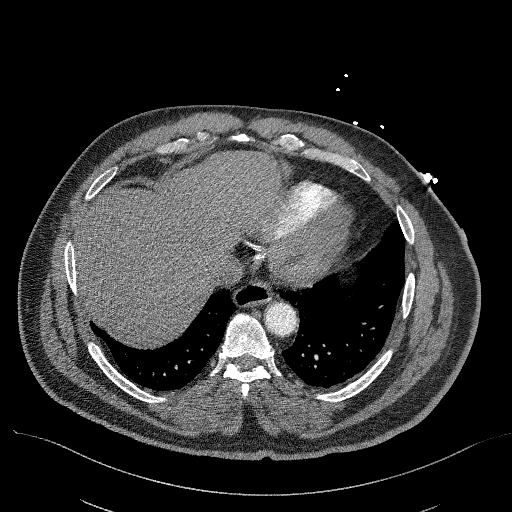
[im 134/306  lung]
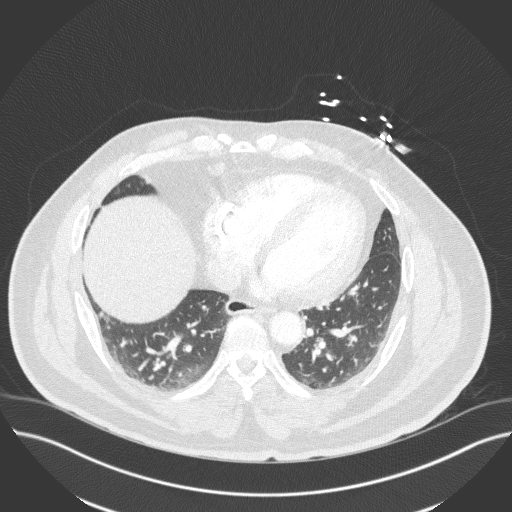
[im 153/306  soft-tissue]
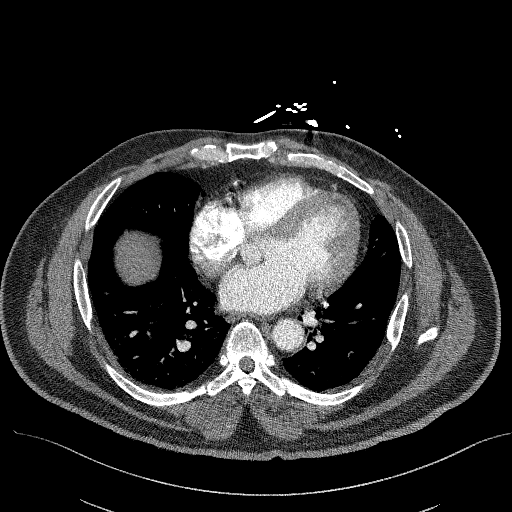
[im 172/306  lung]
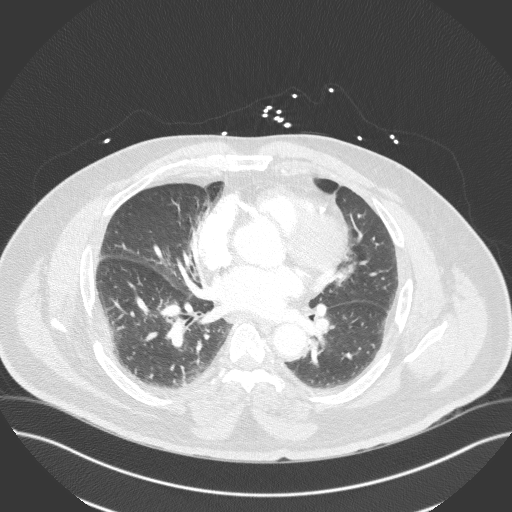
[im 191/306  soft-tissue]
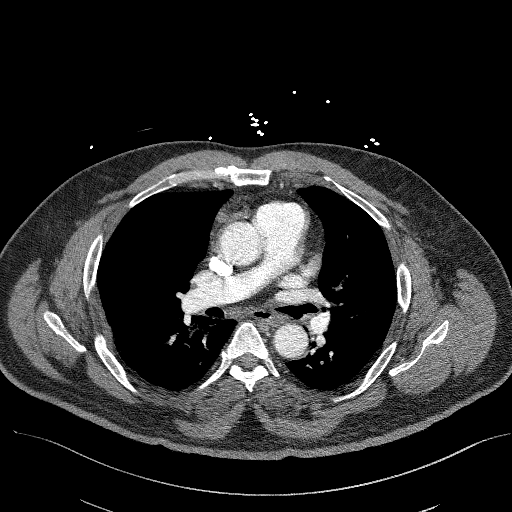
[im 210/306  lung]
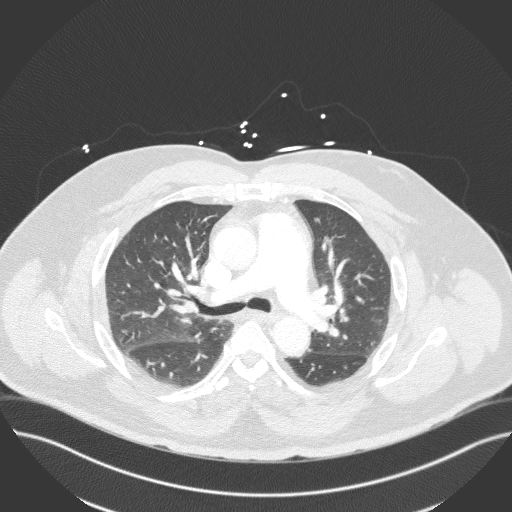
[im 229/306  soft-tissue]
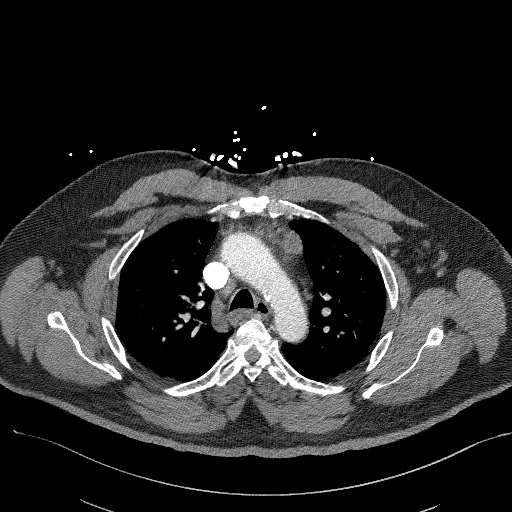
[im 248/306  lung]
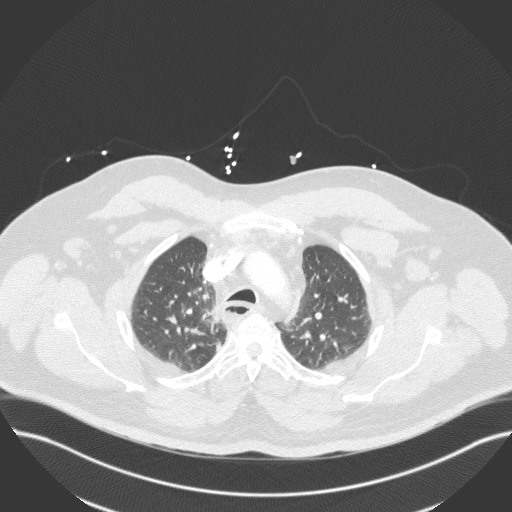
[im 267/306  soft-tissue]
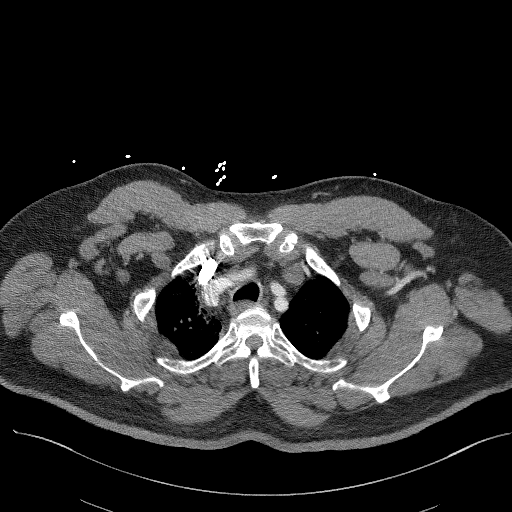
[im 286/306  lung]
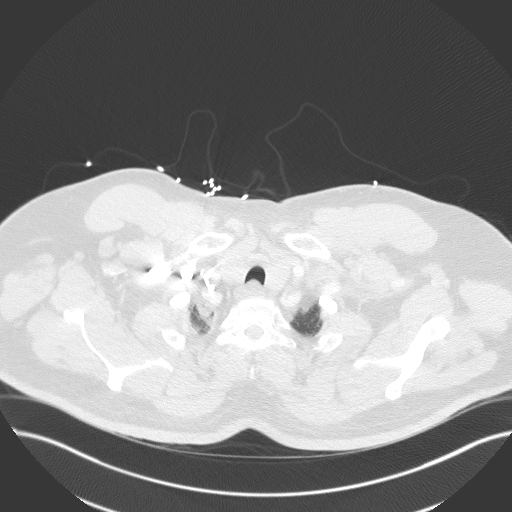

[Series 7: cor soft · coronal · 0.63mm/px · 3 of 144 slices shown]
[im 36/144  soft-tissue]
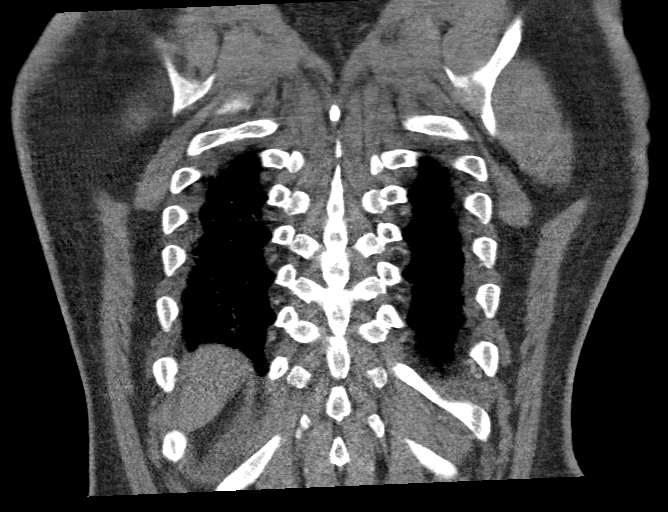
[im 72/144  soft-tissue]
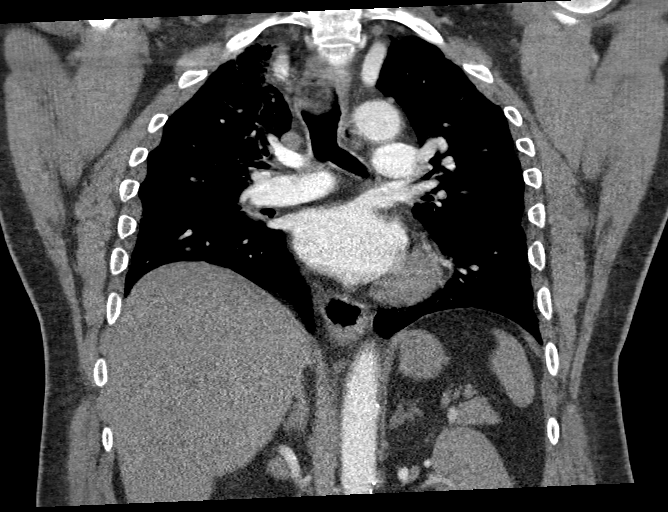
[im 108/144  soft-tissue]
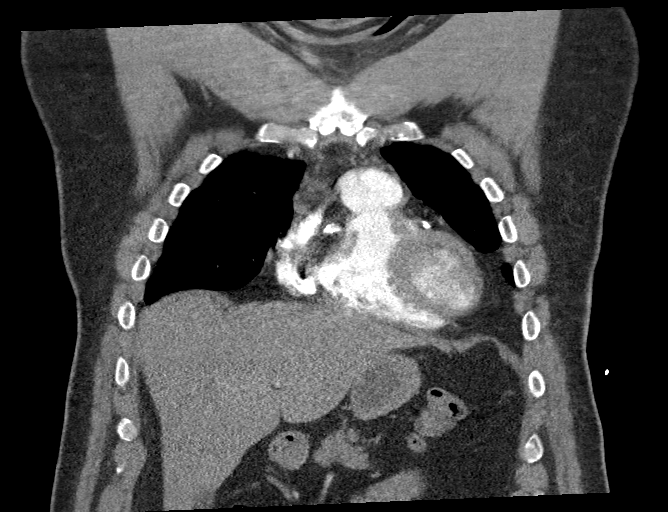

[18 of 46 positions shown; findings below may reference images not displayed]

FINDINGS: Cardiovascular: Satisfactory opacification of the pulmonary arteries
to the segmental level. No evidence of pulmonary embolism. Normal
heart size. No pericardial effusion. Coronary artery calcification.
Thoracic aorta is normal in caliber with minimal calcified plaque.

Mediastinum/Nodes: No enlarged nodes.

Lungs/Pleura: Imaging during expiration. Mucous plugging at the lung
bases. Mild bibasilar atelectasis. Scarring at the right lung apex.
4 mm somewhat nodular opacity of the lower lobe abutting the
junction of major and minor fissures (series 6, image 62).

Upper Abdomen: No acute abnormality.

Musculoskeletal: No acute abnormality.

Review of the MIP images confirms the above findings.
IMPRESSION: No evidence of acute pulmonary embolism.

Mild bibasilar atelectasis and mucous plugging at the lung bases.

4 mm nodular opacity right lower lobe. No follow-up needed if
patient is low-risk. Non-contrast chest CT can be considered in 12
months if patient is high-risk. This recommendation follows the
consensus statement: Guidelines for Management of Incidental
Pulmonary Nodules Detected on CT Images: From the [HOSPITAL]

Coronary artery calcification.
